# Patient Record
Sex: Male | Born: 1950 | Race: White | Hispanic: No | Smoking: Former smoker
Health system: Southern US, Community
[De-identification: ages and names within clinical notes are randomized; demographics above are authoritative.]

## PROBLEM LIST (undated history)

## (undated) DIAGNOSIS — N189 Chronic kidney disease, unspecified: Secondary | ICD-10-CM

## (undated) DIAGNOSIS — I10 Essential (primary) hypertension: Secondary | ICD-10-CM

## (undated) DIAGNOSIS — E119 Type 2 diabetes mellitus without complications: Secondary | ICD-10-CM

## (undated) DIAGNOSIS — M109 Gout, unspecified: Secondary | ICD-10-CM

## (undated) DIAGNOSIS — R972 Elevated prostate specific antigen [PSA]: Secondary | ICD-10-CM

## (undated) DIAGNOSIS — Z87442 Personal history of urinary calculi: Secondary | ICD-10-CM

## (undated) HISTORY — DX: Elevated prostate specific antigen (PSA): R97.20

## (undated) HISTORY — PX: PROSTATE BIOPSY: SHX241

## (undated) HISTORY — DX: Gout, unspecified: M10.9

## (undated) HISTORY — PX: TONSILLECTOMY: SUR1361

## (undated) HISTORY — PX: CYSTECTOMY: SUR359

---

## 1985-07-15 DIAGNOSIS — N189 Chronic kidney disease, unspecified: Secondary | ICD-10-CM

## 1985-07-15 HISTORY — DX: Chronic kidney disease, unspecified: N18.9

## 2002-06-26 ENCOUNTER — Encounter: Payer: Self-pay | Admitting: Emergency Medicine

## 2002-06-26 ENCOUNTER — Emergency Department (HOSPITAL_COMMUNITY): Admission: EM | Admit: 2002-06-26 | Discharge: 2002-06-26 | Payer: Self-pay | Admitting: Emergency Medicine

## 2009-08-27 ENCOUNTER — Encounter: Payer: Self-pay | Admitting: Internal Medicine

## 2009-09-03 ENCOUNTER — Encounter: Payer: Self-pay | Admitting: Internal Medicine

## 2009-09-08 ENCOUNTER — Ambulatory Visit: Payer: Self-pay | Admitting: Internal Medicine

## 2009-09-08 ENCOUNTER — Telehealth (INDEPENDENT_AMBULATORY_CARE_PROVIDER_SITE_OTHER): Payer: Self-pay | Admitting: *Deleted

## 2009-09-08 DIAGNOSIS — Z87891 Personal history of nicotine dependence: Secondary | ICD-10-CM

## 2009-09-08 DIAGNOSIS — J189 Pneumonia, unspecified organism: Secondary | ICD-10-CM | POA: Insufficient documentation

## 2009-09-21 ENCOUNTER — Encounter: Payer: Self-pay | Admitting: Internal Medicine

## 2009-10-31 ENCOUNTER — Ambulatory Visit: Payer: Self-pay | Admitting: Internal Medicine

## 2009-10-31 DIAGNOSIS — R030 Elevated blood-pressure reading, without diagnosis of hypertension: Secondary | ICD-10-CM

## 2010-08-14 NOTE — Letter (Signed)
Summary: [facility]   Imported By: Sherian Rein 10/04/2009 08:55:40  _____________________________________________________________________  External Attachment:    Type:   Image     Comment:   External Document

## 2010-08-14 NOTE — Letter (Signed)
Summary: [facility]  [facility]   Imported By: Sherian Rein 10/04/2009 08:54:42  _____________________________________________________________________  External Attachment:    Type:   Image     Comment:   External Document

## 2010-08-14 NOTE — Progress Notes (Signed)
Summary: Medical release completed  Medical release completed for receipt of records from [facility]. Faxed to [phone]. Fernando Jordan  September 08, 2009 2:18 PM  Appended Document: Medical release completed Records received from [facility]. [name] aware. Forwarded to her on the dumbwaiter for Dr. Posey Rea to review.

## 2010-08-14 NOTE — Letter (Signed)
Summary: Generic Letter  [facility]  [address] [place], [place] [address]   Phone: [phone]  Fax: [phone]    09/21/2009  [facility] [address] [place], [place]  [address]  Higinio Roger,   I got your papers from the Urgent Care. you did have a pneumonia in the right lung. We should repeat your chest x ray here in a couple months to make sure the pneumonia infiltrate has resolved.           Sincerely,   Jacinta Shoe MD

## 2010-08-14 NOTE — Assessment & Plan Note (Signed)
Summary: new / ok dr Macario Golds / willl bring [phone] - no ins/cd   Vital Signs:  Patient profile:   60 year old male Height:      68 inches Weight:      193 pounds BMI:     29.45 O2 Sat:      97 % Temp:     98.2 degrees F oral Pulse rate:   83 / minute BP sitting:   136 / 96  (left arm)  Vitals Entered By: Tora Perches (September 08, 2009 1:44 PM) CC: new pt to est. Is Patient Diabetic? No   CC:  new pt to est..  History of Present Illness: F/u pneumonia R x 2 wks - went to UC, better now: no CP or SOB Had a reaction to Avelox 1 wk ago: rash and swollen joints - resolved  Preventive Screening-Counseling & Management  Alcohol-Tobacco     Smoking Status: quit  Current Medications (verified): 1)  None  Allergies (verified): 1)  ! Avelox (Moxifloxacin Hcl)  Past History:  Past Medical History: Kidney stones  Past Surgical History: Tonsillectomy  Family History: F died of a heart attack 97  Social History: Divorced 2 children Former Smoker Alcohol use-yes Smoking Status:  quit  Review of Systems  The patient denies anorexia, fever, weight loss, weight gain, vision loss, decreased hearing, hoarseness, chest pain, syncope, dyspnea on exertion, peripheral edema, prolonged cough, headaches, hemoptysis, abdominal pain, melena, hematochezia, severe indigestion/heartburn, hematuria, incontinence, genital sores, muscle weakness, suspicious skin lesions, transient blindness, difficulty walking, depression, unusual weight change, abnormal bleeding, enlarged lymph nodes, and angioedema.         tired cough is better  Physical Exam  General:  NAD Mouth:  WNL Lungs:  CTA B Heart:  RRR Abdomen:  Bowel sounds positive,abdomen soft and non-tender without masses, organomegaly or hernias noted. Msk:  No deformity or scoliosis noted of thoracic or lumbar spine.   Extremities:  No clubbing, cyanosis, edema, or deformity noted with normal full range of motion of all joints.     Neurologic:  No cranial nerve deficits noted. Station and gait are normal. Plantar reflexes are down-going bilaterally. DTRs are symmetrical throughout. Sensory, motor and coordinative functions appear intact. Skin:  Intact without suspicious lesions or rashes Psych:  Cognition and judgment appear intact. Alert and cooperative with normal attention span and concentration. No apparent delusions, illusions, hallucinations   Impression & Recommendations:  Problem # 1:  PNEUMONIA (ICD-486) - treated Assessment Improved  Get UC Xrauy and records  Orders: No Charge Patient Arrived (NCPA0) (NCPA0)  Problem # 2:  Fam h/o CAD Assessment: Comment Only See "Patient Instructions".   Complete Medication List: 1)  Vitamin D 1000 Unit Tabs (Cholecalciferol) .Marland Kitchen.. 1 by mouth qd 2)  Aspirin 81 Mg Tbec (Aspirin) .Marland Kitchen.. 1 by mouth qd  Patient Instructions: 1)  Call if you are not better in a reasonable amount of time or if worse.  [phone]

## 2010-08-14 NOTE — Assessment & Plan Note (Signed)
Summary: FU ON PNEMONIA/ REPEAT X-RAY? /NWS   Vital Signs:  Patient profile:   60 year old male Height:      68 inches Weight:      194.75 pounds BMI:     29.72 O2 Sat:      97 % on Room air Temp:     97.5 degrees F oral Pulse rate:   71 / minute BP sitting:   150 / 90  (left arm) Cuff size:   regular  Vitals Entered By: Lucious Groves (October 31, 2009 10:00 AM)  O2 Flow:  Room air CC: F/U pneumonia--Pt denies any remaining symptoms./kb Is Patient Diabetic? No Pain Assessment Patient in pain? no        CC:  F/U pneumonia--Pt denies any remaining symptoms./kb.  History of Present Illness: F/u pneumonia. Feeling well.  Current Medications (verified): 1)  Vitamin D 1000 Unit Tabs (Cholecalciferol) .Marland Kitchen.. 1 By Mouth Qd 2)  Aspirin 81 Mg Tbec (Aspirin) .Marland Kitchen.. 1 By Mouth Qd  Allergies (verified): 1)  ! Avelox (Moxifloxacin Hcl)  Past History:  Past Medical History: Kidney stones Blood donor Pneumonia 2011 RML  Physical Exam  General:  NAD Lungs:  CTA B Heart:  RRR   Impression & Recommendations:  Problem # 1:  PNEUMONIA (ICD-486) resolved Assessment Improved  Orders: T-2 View CXR, Same Day (71020.5TC) No Charge Patient Arrived (NCPA0) (NCPA0)  Problem # 2:  ELEVATED BLOOD PRESSURE (ICD-796.2) Assessment: Unchanged States BP is better (NL) at home  Complete Medication List: 1)  Vitamin D 1000 Unit Tabs (Cholecalciferol) .Marland Kitchen.. 1 by mouth qd 2)  Aspirin 81 Mg Tbec (Aspirin) .Marland Kitchen.. 1 by mouth qd  Patient Instructions: 1)  Call if problems

## 2011-10-07 ENCOUNTER — Telehealth: Payer: Self-pay

## 2011-10-07 ENCOUNTER — Ambulatory Visit: Payer: Self-pay | Admitting: Emergency Medicine

## 2011-10-07 DIAGNOSIS — M109 Gout, unspecified: Secondary | ICD-10-CM

## 2011-10-07 DIAGNOSIS — I10 Essential (primary) hypertension: Secondary | ICD-10-CM

## 2011-10-07 MED ORDER — INDOMETHACIN 50 MG PO CAPS: 50.0000 mg | ORAL_CAPSULE | ORAL | Status: DC | PRN

## 2011-10-07 NOTE — Patient Instructions (Signed)
Gout Gout is an inflammatory condition (arthritis) caused by a buildup of uric acid crystals in the joints. Uric acid is a chemical that is normally present in the blood. Under some circumstances, uric acid can form into crystals in your joints. This causes joint redness, soreness, and swelling (inflammation). Repeat attacks are common. Over time, uric acid crystals can form into masses (tophi) near a joint, causing disfigurement. Gout is treatable and often preventable. CAUSES  The disease begins with elevated levels of uric acid in the blood. Uric acid is produced by your body when it breaks down a naturally found substance called purines. This also happens when you eat certain foods such as meats and fish. Causes of an elevated uric acid level include:  Being passed down from parent to child (heredity).   Diseases that cause increased uric acid production (obesity, psoriasis, some cancers).   Excessive alcohol use.   Diet, especially diets rich in meat and seafood.   Medicines, including certain cancer-fighting drugs (chemotherapy), diuretics, and aspirin.   Chronic kidney disease. The kidneys are no longer able to remove uric acid well.   Problems with metabolism.  Conditions strongly associated with gout include:  Obesity.   High blood pressure.   High cholesterol.   Diabetes.  Not everyone with elevated uric acid levels gets gout. It is not understood why some people get gout and others do not. Surgery, joint injury, and eating too much of certain foods are some of the factors that can lead to gout. SYMPTOMS   An attack of gout comes on quickly. It causes intense pain with redness, swelling, and warmth in a joint.   Fever can occur.   Often, only one joint is involved. Certain joints are more commonly involved:   Base of the big toe.   Knee.   Ankle.   Wrist.   Finger.  Without treatment, an attack usually goes away in a few days to weeks. Between attacks, you  usually will not have symptoms, which is different from many other forms of arthritis. DIAGNOSIS  Your caregiver will suspect gout based on your symptoms and exam. Removal of fluid from the joint (arthrocentesis) is done to check for uric acid crystals. Your caregiver will give you a medicine that numbs the area (local anesthetic) and use a needle to remove joint fluid for exam. Gout is confirmed when uric acid crystals are seen in joint fluid, using a special microscope. Sometimes, blood, urine, and X-ray tests are also used. TREATMENT  There are 2 phases to gout treatment: treating the sudden onset (acute) attack and preventing attacks (prophylaxis). Treatment of an Acute Attack  Medicines are used. These include anti-inflammatory medicines or steroid medicines.   An injection of steroid medicine into the affected joint is sometimes necessary.   The painful joint is rested. Movement can worsen the arthritis.   You may use warm or cold treatments on painful joints, depending which works best for you.   Discuss the use of coffee, vitamin C, or cherries with your caregiver. These may be helpful treatment options.  Treatment to Prevent Attacks After the acute attack subsides, your caregiver may advise prophylactic medicine. These medicines either help your kidneys eliminate uric acid from your body or decrease your uric acid production. You may need to stay on these medicines for a very long time. The early phase of treatment with prophylactic medicine can be associated with an increase in acute gout attacks. For this reason, during the first few months   of treatment, your caregiver may also advise you to take medicines usually used for acute gout treatment. Be sure you understand your caregiver's directions. You should also discuss dietary treatment with your caregiver. Certain foods such as meats and fish can increase uric acid levels. Other foods such as dairy can decrease levels. Your caregiver  can give you a list of foods to avoid. HOME CARE INSTRUCTIONS   Do not take aspirin to relieve pain. This raises uric acid levels.   Only take over-the-counter or prescription medicines for pain, discomfort, or fever as directed by your caregiver.   Rest the joint as much as possible. When in bed, keep sheets and blankets off painful areas.   Keep the affected joint raised (elevated).   Use crutches if the painful joint is in your leg.   Drink enough water and fluids to keep your urine clear or pale yellow. This helps your body get rid of uric acid. Do not drink alcoholic beverages. They slow the passage of uric acid.   Follow your caregiver's dietary instructions. Pay careful attention to the amount of protein you eat. Your daily diet should emphasize fruits, vegetables, whole grains, and fat-free or low-fat milk products.   Maintain a healthy body weight.  SEEK MEDICAL CARE IF:   You have an oral temperature above 102 F (38.9 C).   You develop diarrhea, vomiting, or any side effects from medicines.   You do not feel better in 24 hours, or you are getting worse.  SEEK IMMEDIATE MEDICAL CARE IF:   Your joint becomes suddenly more tender and you have:   Chills.   An oral temperature above 102 F (38.9 C), not controlled by medicine.  MAKE SURE YOU:   Understand these instructions.   Will watch your condition.   Will get help right away if you are not doing well or get worse.  Document Released: 06/28/2000 Document Revised: 06/20/2011 Document Reviewed: 10/09/2009 [facility] Patient Information 2012 [place], [place].Hypertension As your heart beats, it forces blood through your arteries. This force is your blood pressure. If the pressure is too high, it is called hypertension (HTN) or high blood pressure. HTN is dangerous because you may have it and not know it. High blood pressure may mean that your heart has to work harder to pump blood. Your arteries may be narrow or stiff.  The extra work puts you at risk for heart disease, stroke, and other problems.  Blood pressure consists of two numbers, a higher number over a lower, 110/72, for example. It is stated as "110 over 72." The ideal is below 120 for the top number (systolic) and under 80 for the bottom (diastolic). Write down your blood pressure today. You should pay close attention to your blood pressure if you have certain conditions such as:  Heart failure.   Prior heart attack.   Diabetes   Chronic kidney disease.   Prior stroke.   Multiple risk factors for heart disease.  To see if you have HTN, your blood pressure should be measured while you are seated with your arm held at the level of the heart. It should be measured at least twice. A one-time elevated blood pressure reading (especially in the Emergency Department) does not mean that you need treatment. There may be conditions in which the blood pressure is different between your right and left arms. It is important to see your caregiver soon for a recheck. Most people have essential hypertension which means that there is not  a specific cause. This type of high blood pressure may be lowered by changing lifestyle factors such as:  Stress.   Smoking.   Lack of exercise.   Excessive weight.   Drug/tobacco/alcohol use.   Eating less salt.  Most people do not have symptoms from high blood pressure until it has caused damage to the body. Effective treatment can often prevent, delay or reduce that damage. TREATMENT  When a cause has been identified, treatment for high blood pressure is directed at the cause. There are a large number of medications to treat HTN. These fall into several categories, and your caregiver will help you select the medicines that are best for you. Medications may have side effects. You should review side effects with your caregiver. If your blood pressure stays high after you have made lifestyle changes or started on medicines,     Your medication(s) may need to be changed.   Other problems may need to be addressed.   Be certain you understand your prescriptions, and know how and when to take your medicine.   Be sure to follow up with your caregiver within the time frame advised (usually within two weeks) to have your blood pressure rechecked and to review your medications.   If you are taking more than one medicine to lower your blood pressure, make sure you know how and at what times they should be taken. Taking two medicines at the same time can result in blood pressure that is too low.  SEEK IMMEDIATE MEDICAL CARE IF:  You develop a severe headache, blurred or changing vision, or confusion.   You have unusual weakness or numbness, or a faint feeling.   You have severe chest or abdominal pain, vomiting, or breathing problems.  MAKE SURE YOU:   Understand these instructions.   Will watch your condition.   Will get help right away if you are not doing well or get worse.  Document Released: 07/01/2005 Document Revised: 06/20/2011 Document Reviewed: 02/19/2008 [facility] Patient Information 2012 [place], [place].

## 2011-10-07 NOTE — Telephone Encounter (Signed)
PT WENT TO PHARMACY TO GET HIS MEDS REFILLED AND WAS ONLY GIVEN 21 WHEN HE USUALLY GET 30. PLEASE CALL [phone]

## 2011-10-07 NOTE — Telephone Encounter (Signed)
Please call patient let him know I did put a refill on his medications so he has enough for a full week with a refill.

## 2011-10-07 NOTE — Telephone Encounter (Signed)
LMOM notifying patient.

## 2011-10-07 NOTE — Progress Notes (Signed)
  Subjective:    Patient ID: Fernando Jordan, male    DOB: May 30, 1951, 61 y.o.   MRN: [id number]  HPI patient enters to get a refill on his indomethacin he takes for gout. He intermittently has further service gallop. He has no history of hypertension. He gave blood recently and was told his pressure was okay to give blood.    Review of Systems specifically he denies chest pain shortness of breath and needs ongoing symptoms. His recent gout flare was involving the Achilles tendon to his right ankle.     Objective:   Physical Exam  Eyes: Pupils are equal, round, and reactive to light.  Neck: No JVD present. No thyromegaly present.  Cardiovascular: Normal rate and regular rhythm.   Pulmonary/Chest: Breath sounds normal. No respiratory distress. He has no wheezes.  Musculoskeletal:       Patient has tenderness at the Achilles attachment to the right heel .  Lymphadenopathy:    He has no cervical adenopathy.          Assessment & Plan:  Assessments elevated blood pressure. Patient is not financially able to go through any testing at the present time. He does not want to take medication for his blood pressure. He is willing to check his pressure regularly. I would give him a handout about low salt exercise and weight loss. Advised that he should have a checkup but he is unable to because of no insurance

## 2012-05-11 ENCOUNTER — Ambulatory Visit: Payer: Self-pay | Admitting: *Deleted

## 2012-05-11 DIAGNOSIS — Z23 Encounter for immunization: Secondary | ICD-10-CM

## 2013-01-17 ENCOUNTER — Ambulatory Visit: Payer: Self-pay | Admitting: Family Medicine

## 2013-01-17 VITALS — BP 170/90 | HR 101 | Temp 99.1°F | Resp 18 | Ht 67.0 in | Wt 195.0 lb

## 2013-01-17 DIAGNOSIS — R030 Elevated blood-pressure reading, without diagnosis of hypertension: Secondary | ICD-10-CM

## 2013-01-17 DIAGNOSIS — M109 Gout, unspecified: Secondary | ICD-10-CM

## 2013-01-17 DIAGNOSIS — I10 Essential (primary) hypertension: Secondary | ICD-10-CM

## 2013-01-17 DIAGNOSIS — Z79899 Other long term (current) drug therapy: Secondary | ICD-10-CM

## 2013-01-17 LAB — COMPREHENSIVE METABOLIC PANEL
AST: 13 U/L (ref 0–37)
BUN: 18 mg/dL (ref 6–23)
Calcium: 8.9 mg/dL (ref 8.4–10.5)
Chloride: 105 mEq/L (ref 96–112)
Creat: 1.13 mg/dL (ref 0.50–1.35)

## 2013-01-17 MED ORDER — PREDNISONE 50 MG PO TABS: 50.0000 mg | ORAL_TABLET | Freq: Every day | ORAL | Status: DC

## 2013-01-17 MED ORDER — INDOMETHACIN 50 MG PO CAPS: 50.0000 mg | ORAL_CAPSULE | ORAL | Status: DC | PRN

## 2013-01-17 MED ORDER — ALLOPURINOL 300 MG PO TABS: 300.0000 mg | ORAL_TABLET | Freq: Every day | ORAL | Status: AC

## 2013-01-17 NOTE — Progress Notes (Signed)
  Subjective:    Patient ID: Fernando Jordan, male    DOB: 1951-07-07, 62 y.o.   MRN: [id number] Chief Complaint  Patient presents with  . Gout    right knee since January 15, 2013   HPI  For the last month or so he has had intermittent flairs of gout in his bilateral knees and ankles.  Is currently having a flair in his right knee which started 2d ago.  Has been taking indomethacin regularly for the past few days.  Would be very interested in preventative medicine that he could take daily so he does not have so many flairs. Leaving in 3d for [place] x 6 wks for a job. Would like to minimize.  Past Medical History  Diagnosis Date  . Gout    No current outpatient prescriptions on file prior to visit.   No current facility-administered medications on file prior to visit.   Allergies  Allergen Reactions  . Moxifloxacin     REACTION: rash and swollen joints      Review of Systems  Constitutional: Positive for activity change. Negative for fever, chills and diaphoresis.  Cardiovascular: Negative for leg swelling.  Musculoskeletal: Positive for joint swelling, arthralgias and gait problem. Negative for myalgias.  Skin: Positive for color change. Negative for pallor and rash.  Hematological: Negative for adenopathy. Does not bruise/bleed easily.  Psychiatric/Behavioral: Positive for sleep disturbance.      BP 170/90  Pulse 101  Temp(Src) 99.1 F (37.3 C) (Oral)  Resp 18  Ht 5\' 7"  (1.702 m)  Wt 195 lb (88.451 kg)  BMI 30.53 kg/m2  SpO2 99% Objective:   Physical Exam  Constitutional: He is oriented to person, place, and time. He appears well-developed and well-nourished. No distress.  HENT:  Head: Normocephalic and atraumatic.  Eyes: No scleral icterus.  Pulmonary/Chest: Effort normal.  Musculoskeletal:       Right knee: He exhibits decreased range of motion, swelling, effusion and erythema. Tenderness found. Medial joint line and lateral joint line tenderness noted.  Right  knee warm with moderate tenderness and mild erythema.  Neurological: He is alert and oriented to person, place, and time.  Skin: Skin is warm and dry. He is not diaphoretic.  Psychiatric: He has a normal mood and affect. His behavior is normal.      Assessment & Plan:  ELEVATED BLOOD PRESSURE  Essential hypertension, benign - Checks 4x/yr at [facility] - always ok - has white coat HTN.  Gout  Encounter for long-term (current) use of other medications - Plan: Uric acid, Comprehensive metabolic panel - If labs are ok and confirm elevated uric acid level, will refill allopurinol x 1 yr.    HM - Will be eligible for Medicare in 1 yr and will RTC for initial medicare wellness exam.  Meds ordered this encounter  Medications  . predniSONE (DELTASONE) 50 MG tablet    Sig: Take 1 tablet (50 mg total) by mouth daily.    Dispense:  5 tablet    Refill:  1  . indomethacin (INDOCIN) 50 MG capsule    Sig: Take 1 capsule (50 mg total) by mouth as needed.    Dispense:  21 capsule    Refill:  1  . allopurinol (ZYLOPRIM) 300 MG tablet    Sig: Take 1 tablet (300 mg total) by mouth daily.    Dispense:  30 tablet    Refill:  2

## 2013-01-17 NOTE — Patient Instructions (Addendum)
Gout  Gout is an inflammatory condition (arthritis) caused by a buildup of uric acid crystals in the joints. Uric acid is a chemical that is normally present in the blood. Under some circumstances, uric acid can form into crystals in your joints. This causes joint redness, soreness, and swelling (inflammation). Repeat attacks are common. Over time, uric acid crystals can form into masses (tophi) near a joint, causing disfigurement. Gout is treatable and often preventable.  CAUSES   The disease begins with elevated levels of uric acid in the blood. Uric acid is produced by your body when it breaks down a naturally found substance called purines. This also happens when you eat certain foods such as meats and fish. Causes of an elevated uric acid level include:   Being passed down from parent to child (heredity).   Diseases that cause increased uric acid production (obesity, psoriasis, some cancers).   Excessive alcohol use.   Diet, especially diets rich in meat and seafood.   Medicines, including certain cancer-fighting drugs (chemotherapy), diuretics, and aspirin.   Chronic kidney disease. The kidneys are no longer able to remove uric acid well.   Problems with metabolism.  Conditions strongly associated with gout include:   Obesity.   High blood pressure.   High cholesterol.   Diabetes.  Not everyone with elevated uric acid levels gets gout. It is not understood why some people get gout and others do not. Surgery, joint injury, and eating too much of certain foods are some of the factors that can lead to gout.  SYMPTOMS    An attack of gout comes on quickly. It causes intense pain with redness, swelling, and warmth in a joint.   Fever can occur.   Often, only one joint is involved. Certain joints are more commonly involved:   Base of the big toe.   Knee.   Ankle.   Wrist.   Finger.  Without treatment, an attack usually goes away in a few days to weeks. Between attacks, you usually will not have  symptoms, which is different from many other forms of arthritis.  DIAGNOSIS   Your caregiver will suspect gout based on your symptoms and exam. Removal of fluid from the joint (arthrocentesis) is done to check for uric acid crystals. Your caregiver will give you a medicine that numbs the area (local anesthetic) and use a needle to remove joint fluid for exam. Gout is confirmed when uric acid crystals are seen in joint fluid, using a special microscope. Sometimes, blood, urine, and X-ray tests are also used.  TREATMENT   There are 2 phases to gout treatment: treating the sudden onset (acute) attack and preventing attacks (prophylaxis).  Treatment of an Acute Attack   Medicines are used. These include anti-inflammatory medicines or steroid medicines.   An injection of steroid medicine into the affected joint is sometimes necessary.   The painful joint is rested. Movement can worsen the arthritis.   You may use warm or cold treatments on painful joints, depending which works best for you.   Discuss the use of coffee, vitamin C, or cherries with your caregiver. These may be helpful treatment options.  Treatment to Prevent Attacks  After the acute attack subsides, your caregiver may advise prophylactic medicine. These medicines either help your kidneys eliminate uric acid from your body or decrease your uric acid production. You may need to stay on these medicines for a very long time.  The early phase of treatment with prophylactic medicine can be associated   with an increase in acute gout attacks. For this reason, during the first few months of treatment, your caregiver may also advise you to take medicines usually used for acute gout treatment. Be sure you understand your caregiver's directions.  You should also discuss dietary treatment with your caregiver. Certain foods such as meats and fish can increase uric acid levels. Other foods such as dairy can decrease levels. Your caregiver can give you a list of foods  to avoid.  HOME CARE INSTRUCTIONS    Do not take aspirin to relieve pain. This raises uric acid levels.   Only take over-the-counter or prescription medicines for pain, discomfort, or fever as directed by your caregiver.   Rest the joint as much as possible. When in bed, keep sheets and blankets off painful areas.   Keep the affected joint raised (elevated).   Use crutches if the painful joint is in your leg.   Drink enough water and fluids to keep your urine clear or pale yellow. This helps your body get rid of uric acid. Do not drink alcoholic beverages. They slow the passage of uric acid.   Follow your caregiver's dietary instructions. Pay careful attention to the amount of protein you eat. Your daily diet should emphasize fruits, vegetables, whole grains, and fat-free or low-fat milk products.   Maintain a healthy body weight.  SEEK MEDICAL CARE IF:    You have an oral temperature above 102 F (38.9 C).   You develop diarrhea, vomiting, or any side effects from medicines.   You do not feel better in 24 hours, or you are getting worse.  SEEK IMMEDIATE MEDICAL CARE IF:    Your joint becomes suddenly more tender and you have:   Chills.   An oral temperature above 102 F (38.9 C), not controlled by medicine.  MAKE SURE YOU:    Understand these instructions.   Will watch your condition.   Will get help right away if you are not doing well or get worse.  Document Released: 06/28/2000 Document Revised: 09/23/2011 Document Reviewed: 10/09/2009  [employer] Patient Information 2014 [employer].

## 2013-01-26 ENCOUNTER — Telehealth: Payer: Self-pay

## 2013-01-26 NOTE — Telephone Encounter (Signed)
Pt called and reported that his gout flair has resolved after taking the prednisone (completed last Thurs) and the allopurinol. He stated that the last 3-4 nights he has been having to get up to urinate 3 or 4 times a night, but that he then has trouble getting urine to flow. D/W him possibility of UTI, but advised that there is also a possibility of renal SEs from allopurinol. Advised pt to DC allopurinol and go to an urgent care in [place] where he will be until Aug 20 and have them check his renal function and urine sample, today, to make sure his kidneys are functioning properly. Pt agreed. Dr Clelia Croft, do you have any other instr's for pt?

## 2013-01-26 NOTE — Telephone Encounter (Signed)
Excellent advise. Agree w/ plan.

## 2013-01-31 ENCOUNTER — Telehealth: Payer: Self-pay

## 2013-01-31 NOTE — Telephone Encounter (Signed)
Patient calling wanting to talk to Dr. Clelia Croft. He is in [place] and had a  reaction to the gout medication given from our office- which he says he stopped taking per Dr. Clelia Croft. He was seen at a different urgent care in [place] and they gave him  some medication for that. Patient just wants advice on how to follow up from his reaction in [place] and is concerned about his Gout. Please advise.   Best number: [phone]

## 2013-02-02 NOTE — Telephone Encounter (Signed)
Spoke with patient and wants to know if he should start back on allopurinol. His gout has resolved. After he stopped allopurinol he did take a couple doses of indomethacin and he is not having any pain redness or swelling. He was put on flomax for enlarged prostate that he was dx with at urgent care in [place].  He wasn't sure if he should start back on allopurinol to prevent flare up or should he wait until he gets back to [place]? RTC? Please advise

## 2013-02-02 NOTE — Telephone Encounter (Signed)
His uric acid level was pretty much normal so there is a chance that the allopurinol might not even help him.  However, I do think he should go ahead and restart it.  If he has ANY adverse reactions (more gout flairs, changes in urine, etc) stop immed - it's not worth taking it if he is having any side effects from it. However, it seems likely that the changes in his prostate were completely unrelated to the allopurinol and it is best to start the allopurinol now when he is NOT having ANY gout symptoms.

## 2013-02-03 NOTE — Telephone Encounter (Signed)
Informed pt and he will restart as advised, but will stop immed if any adverse reactions occur.

## 2013-02-06 ENCOUNTER — Telehealth: Payer: Self-pay

## 2013-02-06 NOTE — Telephone Encounter (Signed)
Patient would like a nurse or doctor to call him regarding his gout that he has been struggling with please call him at [phone]

## 2013-02-08 MED ORDER — INDOMETHACIN 50 MG PO CAPS: 50.0000 mg | ORAL_CAPSULE | Freq: Three times a day (TID) | ORAL | Status: DC

## 2013-02-08 NOTE — Telephone Encounter (Signed)
Patient has seen Dr Clelia Croft and was given Allopurinol had reaction from this. He had urinary retention associated with the medication. He states he tried the medication again, and has had problems again. He has d/c the medication. He feels better since d/c the medication, is taking indocin prn. Wants to know if there is anything he can try, please advise. He wants an alternative medication. Please advise.

## 2013-02-08 NOTE — Telephone Encounter (Signed)
Called pt and discussed below with him. He agrees w/ the plan to continue prn indomethacin until further evaluation and w/u of his gout can be done.

## 2013-02-08 NOTE — Telephone Encounter (Signed)
There are other medications but unfortunately none of them are generic and so quite expensive and I am not sure any of them will help so he is likely to spend hundreds of dollars and have the same side effects.  Also, since his uric acid level was actually normal when we checked it I am really not confident at all that he would benefit at all from gout prophylaxis. Probenacid is a different type of gout prophylaxis but we don't really prescribe it as you can't use any NSAID with with - so absolutely no indocin or any otc meds other than tylenol (which doesn't really help gout.)  Probenacid can also be hard on the kidneys so requires close lab monitoring and if he did get a gout flair on it - he would be in a pickle - and still not sure it would actually help at all since his uric acid level was high end of normal range.  Could try colcrys but often causes diarrhea and is VERY expensive - esp w/o insurance.  The best thing to do would be to take daily indocin for prophylaxis - but don't use any other otc meds other than tylenol - and more during gout flair. Then in a year when he gets insurance we can do much more testing to confirm gout diagnosis rather than another type of inflammatory arthritis and be able to try alternative meds w/o breaking the bank.

## 2013-02-11 ENCOUNTER — Encounter: Payer: Self-pay | Admitting: *Deleted

## 2013-02-11 DIAGNOSIS — R350 Frequency of micturition: Secondary | ICD-10-CM

## 2013-03-10 ENCOUNTER — Telehealth: Payer: Self-pay

## 2013-03-10 NOTE — Telephone Encounter (Signed)
What are the questions? He states he has been nauseated and vomited. Thinks was another side effect from the medications for the Gout (indocin) the Allopurinol caused urinary retention (taking Flomax). Advised him to d/c this medication he has stopped it now.

## 2013-03-10 NOTE — Telephone Encounter (Signed)
Patient has some questions for Dr. Clelia Croft. Patient would not specify.  [phone]

## 2013-09-21 DIAGNOSIS — Z23 Encounter for immunization: Secondary | ICD-10-CM | POA: Diagnosis not present

## 2013-11-23 DIAGNOSIS — H5200 Hypermetropia, unspecified eye: Secondary | ICD-10-CM | POA: Diagnosis not present

## 2013-11-23 DIAGNOSIS — H524 Presbyopia: Secondary | ICD-10-CM | POA: Diagnosis not present

## 2013-11-23 DIAGNOSIS — H25039 Anterior subcapsular polar age-related cataract, unspecified eye: Secondary | ICD-10-CM | POA: Diagnosis not present

## 2013-11-23 DIAGNOSIS — H52229 Regular astigmatism, unspecified eye: Secondary | ICD-10-CM | POA: Diagnosis not present

## 2014-05-18 DIAGNOSIS — M109 Gout, unspecified: Secondary | ICD-10-CM | POA: Diagnosis not present

## 2014-05-18 DIAGNOSIS — I10 Essential (primary) hypertension: Secondary | ICD-10-CM | POA: Diagnosis not present

## 2014-05-18 DIAGNOSIS — Z23 Encounter for immunization: Secondary | ICD-10-CM | POA: Diagnosis not present

## 2014-05-18 DIAGNOSIS — T148 Other injury of unspecified body region: Secondary | ICD-10-CM | POA: Diagnosis not present

## 2014-08-18 DIAGNOSIS — D509 Iron deficiency anemia, unspecified: Secondary | ICD-10-CM | POA: Diagnosis not present

## 2014-08-18 DIAGNOSIS — N200 Calculus of kidney: Secondary | ICD-10-CM | POA: Diagnosis not present

## 2014-08-18 DIAGNOSIS — I10 Essential (primary) hypertension: Secondary | ICD-10-CM | POA: Diagnosis not present

## 2014-08-18 DIAGNOSIS — M109 Gout, unspecified: Secondary | ICD-10-CM | POA: Diagnosis not present

## 2014-08-18 DIAGNOSIS — Z125 Encounter for screening for malignant neoplasm of prostate: Secondary | ICD-10-CM | POA: Diagnosis not present

## 2014-08-18 DIAGNOSIS — E559 Vitamin D deficiency, unspecified: Secondary | ICD-10-CM | POA: Diagnosis not present

## 2014-08-18 DIAGNOSIS — H6120 Impacted cerumen, unspecified ear: Secondary | ICD-10-CM | POA: Diagnosis not present

## 2014-08-18 DIAGNOSIS — Z0001 Encounter for general adult medical examination with abnormal findings: Secondary | ICD-10-CM | POA: Diagnosis not present

## 2014-11-03 ENCOUNTER — Other Ambulatory Visit: Payer: Self-pay | Admitting: Gastroenterology

## 2014-11-08 ENCOUNTER — Ambulatory Visit (HOSPITAL_COMMUNITY): Payer: Self-pay | Attending: Gastroenterology

## 2014-11-10 DIAGNOSIS — H2513 Age-related nuclear cataract, bilateral: Secondary | ICD-10-CM | POA: Diagnosis not present

## 2014-11-28 ENCOUNTER — Encounter (HOSPITAL_COMMUNITY): Payer: Self-pay | Admitting: *Deleted

## 2014-11-29 ENCOUNTER — Other Ambulatory Visit: Payer: Self-pay | Admitting: Gastroenterology

## 2014-12-03 NOTE — Anesthesia Preprocedure Evaluation (Addendum)
Anesthesia Evaluation  Patient identified by MRN, date of birth, ID band Patient awake    Reviewed: Allergy & Precautions, NPO status , Patient's Chart, lab work & pertinent test results, reviewed documented beta blocker date and time   Airway Mallampati: II   Neck ROM: Full    Dental  (+) Dental Advisory Given, Implants   Pulmonary former smoker (quit 1972),  breath sounds clear to auscultation        Cardiovascular hypertension, Pt. on medications Rhythm:Regular     Neuro/Psych negative neurological ROS  negative psych ROS   GI/Hepatic RO GI bleeding   Endo/Other    Renal/GU      Musculoskeletal GOUT prednisone   Abdominal (+)  Abdomen: soft.    Peds  Hematology  (+) anemia , anemia   Anesthesia Other Findings BP elevated, no meds this am, will RX Apresoline  Reproductive/Obstetrics                            Anesthesia Physical Anesthesia Plan  ASA: II  Anesthesia Plan: MAC   Post-op Pain Management:    Induction: Intravenous  Airway Management Planned:   Additional Equipment:   Intra-op Plan:   Post-operative Plan:   Informed Consent: I have reviewed the patients History and Physical, chart, labs and discussed the procedure including the risks, benefits and alternatives for the proposed anesthesia with the patient or authorized representative who has indicated his/her understanding and acceptance.     Plan Discussed with:   Anesthesia Plan Comments: (Ask about prednisone)        Anesthesia Quick Evaluation

## 2014-12-05 ENCOUNTER — Ambulatory Visit (HOSPITAL_COMMUNITY)
Admission: RE | Admit: 2014-12-05 | Discharge: 2014-12-05 | Disposition: A | Discharge status: Home / Self Care | Payer: Medicare Other | Source: Ambulatory Visit | Attending: Gastroenterology | Admitting: Gastroenterology

## 2014-12-05 ENCOUNTER — Ambulatory Visit (HOSPITAL_COMMUNITY): Payer: Medicare Other | Admitting: Anesthesiology

## 2014-12-05 ENCOUNTER — Encounter (HOSPITAL_COMMUNITY): Payer: Self-pay | Admitting: Anesthesiology

## 2014-12-05 ENCOUNTER — Encounter (HOSPITAL_COMMUNITY)
Admission: RE | Disposition: A | Discharge status: Home / Self Care | Payer: Self-pay | Source: Ambulatory Visit | Attending: Gastroenterology

## 2014-12-05 DIAGNOSIS — Z7952 Long term (current) use of systemic steroids: Secondary | ICD-10-CM | POA: Diagnosis not present

## 2014-12-05 DIAGNOSIS — Z79899 Other long term (current) drug therapy: Secondary | ICD-10-CM | POA: Diagnosis not present

## 2014-12-05 DIAGNOSIS — K2950 Unspecified chronic gastritis without bleeding: Secondary | ICD-10-CM | POA: Diagnosis not present

## 2014-12-05 DIAGNOSIS — N400 Enlarged prostate without lower urinary tract symptoms: Secondary | ICD-10-CM | POA: Diagnosis not present

## 2014-12-05 DIAGNOSIS — Z87891 Personal history of nicotine dependence: Secondary | ICD-10-CM | POA: Diagnosis not present

## 2014-12-05 DIAGNOSIS — I10 Essential (primary) hypertension: Secondary | ICD-10-CM | POA: Insufficient documentation

## 2014-12-05 DIAGNOSIS — B9681 Helicobacter pylori [H. pylori] as the cause of diseases classified elsewhere: Secondary | ICD-10-CM | POA: Diagnosis not present

## 2014-12-05 DIAGNOSIS — Z791 Long term (current) use of non-steroidal anti-inflammatories (NSAID): Secondary | ICD-10-CM | POA: Insufficient documentation

## 2014-12-05 DIAGNOSIS — K621 Rectal polyp: Secondary | ICD-10-CM | POA: Insufficient documentation

## 2014-12-05 DIAGNOSIS — M109 Gout, unspecified: Secondary | ICD-10-CM | POA: Diagnosis not present

## 2014-12-05 DIAGNOSIS — D509 Iron deficiency anemia, unspecified: Secondary | ICD-10-CM | POA: Insufficient documentation

## 2014-12-05 DIAGNOSIS — K5790 Diverticulosis of intestine, part unspecified, without perforation or abscess without bleeding: Secondary | ICD-10-CM | POA: Diagnosis not present

## 2014-12-05 DIAGNOSIS — K5730 Diverticulosis of large intestine without perforation or abscess without bleeding: Secondary | ICD-10-CM | POA: Insufficient documentation

## 2014-12-05 HISTORY — PX: ESOPHAGOGASTRODUODENOSCOPY (EGD) WITH PROPOFOL: SHX5813

## 2014-12-05 HISTORY — PX: COLONOSCOPY WITH PROPOFOL: SHX5780

## 2014-12-05 HISTORY — DX: Personal history of urinary calculi: Z87.442

## 2014-12-05 SURGERY — COLONOSCOPY WITH PROPOFOL
Anesthesia: Monitor Anesthesia Care

## 2014-12-05 MED ORDER — PROPOFOL INFUSION 10 MG/ML OPTIME
INTRAVENOUS | Status: DC | PRN
  Administered 2014-12-05: 100 ug/kg/min via INTRAVENOUS

## 2014-12-05 MED ORDER — BUTAMBEN-TETRACAINE-BENZOCAINE 2-2-14 % EX AERO
INHALATION_SPRAY | CUTANEOUS | Status: DC | PRN
  Administered 2014-12-05: 1 via TOPICAL

## 2014-12-05 MED ORDER — PROPOFOL 10 MG/ML IV BOLUS
INTRAVENOUS | Status: AC
  Filled 2014-12-05: qty 20

## 2014-12-05 MED ORDER — SODIUM CHLORIDE 0.9 % IV SOLN: INTRAVENOUS | Status: DC

## 2014-12-05 MED ORDER — HYDRALAZINE HCL 20 MG/ML IJ SOLN
INTRAMUSCULAR | Status: DC | PRN
  Administered 2014-12-05: 10 mg via INTRAVENOUS

## 2014-12-05 MED ORDER — PROPOFOL 10 MG/ML IV BOLUS
INTRAVENOUS | Status: DC | PRN
  Administered 2014-12-05 (×2): 40 mg via INTRAVENOUS
  Administered 2014-12-05: 20 mg via INTRAVENOUS

## 2014-12-05 MED ORDER — LACTATED RINGERS IV SOLN
INTRAVENOUS | Status: DC
  Administered 2014-12-05: 1000 mL via INTRAVENOUS

## 2014-12-05 MED ORDER — PROMETHAZINE HCL 25 MG/ML IJ SOLN: 6.2500 mg | INTRAMUSCULAR | Status: DC | PRN

## 2014-12-05 MED ORDER — HYDRALAZINE HCL 20 MG/ML IJ SOLN
INTRAMUSCULAR | Status: AC
  Filled 2014-12-05: qty 1

## 2014-12-05 SURGICAL SUPPLY — 24 items

## 2014-12-05 NOTE — Op Note (Signed)
Problem: Iron deficiency anemia. Hemoglobin 12.7 g. Serum iron saturation 16%.  Endoscopist: Fernando Jordan  Premedication: Propofol administered by anesthesia  Procedure: Diagnostic esophagogastroduodenoscopy with small bowel biopsies and random gastric biopsies The patient was placed in the left lateral decubitus position. The Pentax gastroscope was passed through the posterior hypopharynx into the proximal esophagus without difficulty. The hypopharynx, larynx, and vocal cords appeared normal.  Esophagoscopy: The proximal, mid, and lower segments of the esophageal mucosa appeared normal. The squamocolumnar junction was noted at 40 cm from the incisor teeth. There was no endoscopic evidence for the presence of erosive esophagitis or Barrett's esophagus.  Gastroscopy: Retroflex view of the gastric cardia and fundus was normal. The gastric body, antrum, and pylorus appeared normal. Biopsies were taken from the gastric antrum, gastric body, and gastric cardia to rule out H. pylori gastritis  Duodenoscopy: The duodenal bulb and descending duodenum appeared normal. Two biopsies were taken from the duodenal bulb and four biopsies were taken from the descending duodenum to look for villous atrophy associated with celiac disease.  Assessment: Normal esophagogastroduodenoscopy. Gastric biopsies to rule out H. pylori gastritis and duodenal biopsies to rule out villous atrophy associated with celiac disease pending  Procedure: Diagnostic colonoscopy Anal inspection and digital rectal exam were normal. The Pentax pediatric colonoscope was introduced into the rectum and advanced to the cecum. A normal-appearing appendiceal orifice was identified. A normal-appearing ileocecal valve was identified. Colonic preparation for the exam today was good. Withdrawal time was 10 minutes  Rectum. From the mid rectum a 2 mm sized polyp was removed with the cold biopsy forceps. Retroflexed view of the distal rectum was  normal.  Sigmoid colon and descending colon. Left colonic diverticulosis  Splenic flexure. Normal  Transverse colon. Normal  Hepatic flexure. Normal  Ascending colon. Normal  Cecum and ileocecal valve. Normal  Assessment: A diminutive polyp was removed from the rectum with the cold biopsy forceps. Otherwise normal colonoscopy.  Plan: I will review the rectal polyp pathology to determine when the patient should undergo a repeat colonoscopy

## 2014-12-05 NOTE — Anesthesia Postprocedure Evaluation (Signed)
  Anesthesia Post-op Note  Patient: Janeth RaseVito M Prickett  Procedure(s) Performed: Procedure(s): COLONOSCOPY WITH PROPOFOL (N/A) ESOPHAGOGASTRODUODENOSCOPY (EGD) WITH PROPOFOL (N/A)  Patient Location: PACU  Anesthesia Type:MAC  Level of Consciousness: awake and alert   Airway and Oxygen Therapy: Patient Spontanous Breathing  Post-op Pain: none  Post-op Assessment: Post-op Vital signs reviewed, Patient's Cardiovascular Status Stable, Respiratory Function Stable, Patent Airway and No signs of Nausea or vomiting  Post-op Vital Signs: Reviewed and stable  Last Vitals:  Filed Vitals:   12/05/14 0701  BP: 214/131  Pulse: 66  Temp: 36.6 C  Resp: 10    Complications: No apparent anesthesia complications

## 2014-12-05 NOTE — Discharge Instructions (Signed)

## 2014-12-05 NOTE — H&P (Signed)
  Problem: Iron deficiency anemia. Hemoglobin 12.7 g. Serum iron saturation 16%.  History: The patient is a 64 year old male born 10-Feb-1951. He has unexplained iron deficiency anemia based on a slightly low hemoglobin and serum iron saturation. He does not take nonsteroidal anti-inflammatory medication on a daily basis. He does take indomethacin for gout attacks. Approximately 2 years ago, the patient developed vomiting and abdominal discomfort which resolved after being placed on Prilosec for 30 days. He denies gastrointestinal bleeding.  The patient is scheduled to undergo diagnostic esophagogastroduodenoscopy with screening for celiac disease and H. pylori gastritis followed by diagnostic colonoscopy.  Past medical history: Gout. Benign prostatic hypertrophy. Systolic hypertension. Kidney stones.  Exam: The patient is alert and lying comfortably on the endoscopy stretcher. Abdomen is soft and nontender to palpation. Lungs are clear to auscultation. Cardiac exam reveals a regular rhythm.  Plan: Proceed with diagnostic esophagogastroduodenoscopy and colonoscopy to evaluate iron deficiency anemia.

## 2014-12-05 NOTE — Transfer of Care (Signed)
Immediate Anesthesia Transfer of Care Note  Patient: Fernando Jordan  Procedure(s) Performed: Procedure(s): COLONOSCOPY WITH PROPOFOL (N/A) ESOPHAGOGASTRODUODENOSCOPY (EGD) WITH PROPOFOL (N/A)  Patient Location: PACU  Anesthesia Type:MAC  Level of Consciousness:  sedated, patient cooperative and responds to stimulation  Airway & Oxygen Therapy:Patient Spontanous Breathing and Patient connected to face mask oxgen  Post-op Assessment:  Report given to PACU RN and Post -op Vital signs reviewed and stable  Post vital signs:  Reviewed and stable  Last Vitals:  Filed Vitals:   12/05/14 0800  BP: 123/65  Pulse: 79  Temp: 36.6 C  Resp: 15    Complications: No apparent anesthesia complications

## 2014-12-06 ENCOUNTER — Encounter (HOSPITAL_COMMUNITY): Payer: Self-pay | Admitting: Gastroenterology

## 2015-01-12 DIAGNOSIS — B9681 Helicobacter pylori [H. pylori] as the cause of diseases classified elsewhere: Secondary | ICD-10-CM | POA: Diagnosis not present

## 2015-03-23 DIAGNOSIS — B9681 Helicobacter pylori [H. pylori] as the cause of diseases classified elsewhere: Secondary | ICD-10-CM | POA: Diagnosis not present

## 2015-05-10 DIAGNOSIS — Z0000 Encounter for general adult medical examination without abnormal findings: Secondary | ICD-10-CM | POA: Diagnosis not present

## 2015-05-10 DIAGNOSIS — M109 Gout, unspecified: Secondary | ICD-10-CM | POA: Diagnosis not present

## 2015-05-10 DIAGNOSIS — E559 Vitamin D deficiency, unspecified: Secondary | ICD-10-CM | POA: Diagnosis not present

## 2015-05-10 DIAGNOSIS — D509 Iron deficiency anemia, unspecified: Secondary | ICD-10-CM | POA: Diagnosis not present

## 2015-05-10 DIAGNOSIS — I10 Essential (primary) hypertension: Secondary | ICD-10-CM | POA: Diagnosis not present

## 2015-05-10 DIAGNOSIS — Z23 Encounter for immunization: Secondary | ICD-10-CM | POA: Diagnosis not present

## 2015-07-16 HISTORY — PX: EXTRACORPOREAL SHOCK WAVE LITHOTRIPSY: SHX1557

## 2015-08-24 ENCOUNTER — Other Ambulatory Visit: Payer: Self-pay | Admitting: Internal Medicine

## 2015-08-24 ENCOUNTER — Ambulatory Visit
Admission: RE | Admit: 2015-08-24 | Discharge: 2015-08-24 | Disposition: A | Discharge status: Home / Self Care | Payer: Medicare Other | Source: Ambulatory Visit | Attending: Nurse Practitioner | Admitting: Nurse Practitioner

## 2015-08-24 ENCOUNTER — Other Ambulatory Visit: Payer: Self-pay | Admitting: Nurse Practitioner

## 2015-08-24 DIAGNOSIS — R10A0 Flank pain, unspecified side: Secondary | ICD-10-CM

## 2015-08-24 DIAGNOSIS — R109 Unspecified abdominal pain: Secondary | ICD-10-CM

## 2015-10-10 ENCOUNTER — Other Ambulatory Visit: Payer: Self-pay | Admitting: Urology

## 2015-10-20 ENCOUNTER — Encounter (HOSPITAL_COMMUNITY): Payer: Self-pay | Admitting: *Deleted

## 2015-10-22 NOTE — H&P (Signed)
  History of Present Illness     The patient is a 65 year old gentleman who presents for evaluation of his left distal ureteral calculus. It is 5 mm in width and 7 mm in length. This pain started 2 days ago. He also had nausea and vomiting that has since resolved. He has been taking Flomax and prescription pain meds which has temporize this pain. He denies any fevers or chills. He has had 2 stones in the past. He passed both spontaneously. His last was 15 years ago.     The patient has continued medical expulsive therapy for approximately 6 weeks now. However his stone is unchanged on KUB.   Past Medical History Problems  1. History of nephrolithiasis (Z61.096(Z87.442)  Surgical History Problems  1. History of Surgery Excision Lipoma  Current Meds 1. Allopurinol 100 MG Oral Tablet;  Therapy: (Recorded:10Feb2017) to Recorded 2. Indomethacin 50 MG Oral Capsule;  Therapy: (Recorded:10Feb2017) to Recorded 3. Micardis HCT 40-12.5 MG Oral Tablet;  Therapy: (Recorded:10Feb2017) to Recorded 4. PriLOSEC 20 MG Oral Capsule Delayed Release;  Therapy: (Recorded:10Feb2017) to Recorded 5. Vitamin D TABS;  Therapy: (Recorded:10Feb2017) to Recorded  Allergies Medication  1. Demerol TABS  Family History Problems  1. No pertinent family history : Mother, Father  Social History Problems  1. Alcohol use (Z78.9) 2. Caffeine use (F15.90) 3. Father deceased 734. Former smoker [phone](Z87.891)   smoked fro 4 years/ quit 40 years ago 5. Mother deceased 476. Occupation   Self employed 7. Single  Review of Systems Genitourinary, constitutional, skin, eye, otolaryngeal, hematologic/lymphatic, cardiovascular, pulmonary, endocrine, musculoskeletal, gastrointestinal, neurological and psychiatric system(s) were reviewed and pertinent findings if present are noted and are otherwise negative.   Vitals Vital Signs  Height: 5 ft 8 in Weight: 175 lb  BMI Calculated: 26.61 BSA Calculated: 1.93 Blood Pressure: 167  / 92 Temperature: 96.9 F Heart Rate: 63  Physical Exam Constitutional: Well nourished . No acute distress.  ENT:. The ears and nose are normal in appearance.  Neck: The appearance of the neck is normal.  Pulmonary: No respiratory distress.  Cardiovascular:. No peripheral edema.  Abdomen: The abdomen is soft and nontender.  Skin: Normal skin turgor and no visible rash.  Neuro/Psych:. Mood and affect are appropriate. No focal sensory deficits.    Results/Data Urine  COLOR YELLOW  APPEARANCE CLEAR  SPECIFIC GRAVITY 1.020  pH 5.5  GLUCOSE NEGATIVE  BILIRUBIN NEGATIVE  KETONE NEGATIVE  BLOOD TRACE  PROTEIN NEGATIVE  NITRITE NEGATIVE  LEUKOCYTE ESTERASE NEGATIVE  SQUAMOUS EPITHELIAL/HPF 0-5 HPF WBC 0-5 WBC/HPF RBC 0-2 RBC/HPF BACTERIA NONE SEEN HPF CRYSTALS NONE SEEN HPF CASTS NONE SEEN LPF Yeast NONE SEEN HPF  KUB:  6 mm stone visualized in the same position in the distal left ureter. Normal bony process.   Assessment   I discussed the risks, benefits, and indications for treatment at this time as the patient is failed to progress on medical expulsive therapy. We discussed both ureteroscopy and lithotripsy. The patient has elected to undergo lithotripsy for management of the stone. He understands the risks, benefits, and indications of this procedure. He understands the risks include but are not limited to bleeding, infection, and repeat procedures. He has elected to proceed.   Plan      1. Left distal ureteral stone - unchanged in position from prior visit  -Left ESWL

## 2015-10-22 NOTE — Discharge Instructions (Signed)
Lithotripsy, Care After °Refer to this sheet in the next few weeks. These instructions provide you with information on caring for yourself after your procedure. Your health care provider may also give you more specific instructions. Your treatment has been planned according to current medical practices, but problems sometimes occur. Call your health care provider if you have any problems or questions after your procedure. °WHAT TO EXPECT AFTER THE PROCEDURE  °· Your urine may have a red tinge for a few days after treatment. Blood loss is usually minimal. °· You may have soreness in the back or flank area. This usually goes away after a few days. The procedure can cause blotches or bruises on the back where the pressure wave enters the skin. These marks usually cause only minimal discomfort and should disappear in a short time. °· Stone fragments should begin to pass within 24 hours of treatment. However, a delayed passage is not unusual. °· You may have pain, discomfort, and feel sick to your stomach (nauseated) when the crushed fragments of stone are passed down the tube from the kidney to the bladder. Stone fragments can pass soon after the procedure and may last for up to 4-8 weeks. °· A small number of patients may have severe pain when stone fragments are not able to pass, which leads to an obstruction. °· If your stone is greater than 1 inch (2.5 cm) in diameter or if you have multiple stones that have a combined diameter greater than 1 inch (2.5 cm), you may require more than one treatment. °· If you had a stent placed prior to your procedure, you may experience some discomfort, especially during urination. You may experience the pain or discomfort in your flank or back, or you may experience a sharp pain or discomfort at the base of your penis or in your lower abdomen. The discomfort usually lasts only a few minutes after urinating. °HOME CARE INSTRUCTIONS  °· Rest at home until you feel your energy  improving. °· Only take over-the-counter or prescription medicines for pain, discomfort, or fever as directed by your health care provider. Depending on the type of lithotripsy, you may need to take antibiotics and anti-inflammatory medicines for a few days. °· Drink enough water and fluids to keep your urine clear or pale yellow. This helps "flush" your kidneys. It helps pass any remaining pieces of stone and prevents stones from coming back. °· Most people can resume daily activities within 1-2 days after standard lithotripsy. It can take longer to recover from laser and percutaneous lithotripsy. °· Strain all urine through the provided strainer. Keep all particulate matter and stones for your health care provider to see. The stone may be as small as a grain of salt. It is very important to use the strainer each and every time you pass your urine. Any stones that are found can be sent to a medical lab for examination. °· Visit your health care provider for a follow-up appointment in a few weeks. Your doctor may remove your stent if you have one. Your health care provider will also check to see whether stone particles still remain. °SEEK MEDICAL CARE IF:  °· Your pain is not relieved by medicine. °· You have a lasting nauseous feeling. °· You feel there is too much blood in the urine. °· You develop persistent problems with frequent or painful urination that does not at least partially improve after 2 days following the procedure. °· You have a congested cough. °· You feel   lightheaded. °· You develop a rash or any other signs that might suggest an allergic problem. °· You develop any reaction or side effects to your medicine(s). °SEEK IMMEDIATE MEDICAL CARE IF:  °· You experience severe back or flank pain or both. °· You see nothing but blood when you urinate. °· You cannot pass any urine at all. °· You have a fever or shaking chills. °· You develop shortness of breath, difficulty breathing, or chest pain. °· You  develop vomiting that will not stop after 6-8 hours. °· You have a fainting episode. °  °This information is not intended to replace advice given to you by your health care provider. Make sure you discuss any questions you have with your health care provider. °  °Document Released: 07/21/2007 Document Revised: 03/22/2015 Document Reviewed: 01/14/2013 °Elsevier Interactive Patient Education ©2016 [employer] Inc. ° °

## 2015-10-23 ENCOUNTER — Ambulatory Visit (HOSPITAL_COMMUNITY)
Admission: RE | Admit: 2015-10-23 | Discharge: 2015-10-23 | Disposition: A | Discharge status: Home / Self Care | Payer: Medicare Other | Source: Ambulatory Visit | Attending: Urology | Admitting: Urology

## 2015-10-23 ENCOUNTER — Ambulatory Visit (HOSPITAL_COMMUNITY): Payer: Medicare Other

## 2015-10-23 ENCOUNTER — Encounter (HOSPITAL_COMMUNITY): Payer: Self-pay | Admitting: General Practice

## 2015-10-23 ENCOUNTER — Encounter (HOSPITAL_COMMUNITY)
Admission: RE | Disposition: A | Discharge status: Home / Self Care | Payer: Self-pay | Source: Ambulatory Visit | Attending: Urology

## 2015-10-23 DIAGNOSIS — N201 Calculus of ureter: Secondary | ICD-10-CM | POA: Diagnosis not present

## 2015-10-23 DIAGNOSIS — Z87442 Personal history of urinary calculi: Secondary | ICD-10-CM | POA: Insufficient documentation

## 2015-10-23 DIAGNOSIS — I10 Essential (primary) hypertension: Secondary | ICD-10-CM | POA: Insufficient documentation

## 2015-10-23 DIAGNOSIS — Z79899 Other long term (current) drug therapy: Secondary | ICD-10-CM | POA: Insufficient documentation

## 2015-10-23 HISTORY — DX: Chronic kidney disease, unspecified: N18.9

## 2015-10-23 SURGERY — LITHOTRIPSY, ESWL
Anesthesia: LOCAL | Laterality: Left

## 2015-10-23 MED ORDER — DIPHENHYDRAMINE HCL 25 MG PO CAPS
25.0000 mg | ORAL_CAPSULE | ORAL | Status: AC
  Administered 2015-10-23: 25 mg via ORAL
  Filled 2015-10-23: qty 1

## 2015-10-23 MED ORDER — DIAZEPAM 5 MG PO TABS
10.0000 mg | ORAL_TABLET | ORAL | Status: AC
Start: 2015-10-23 — End: 2015-10-23
  Administered 2015-10-23: 10 mg via ORAL
  Filled 2015-10-23: qty 2

## 2015-10-23 MED ORDER — CEFAZOLIN SODIUM-DEXTROSE 2-4 GM/100ML-% IV SOLN
2.0000 g | INTRAVENOUS | Status: AC
  Administered 2015-10-23: 2 g via INTRAVENOUS
  Filled 2015-10-23 (×2): qty 100

## 2015-10-23 MED ORDER — TAMSULOSIN HCL 0.4 MG PO CAPS
0.4000 mg | ORAL_CAPSULE | Freq: Once | ORAL | Status: AC
  Administered 2015-10-23: 0.4 mg via ORAL
  Filled 2015-10-23: qty 1

## 2015-10-23 MED ORDER — SODIUM CHLORIDE 0.9 % IV SOLN
INTRAVENOUS | Status: DC
  Administered 2015-10-23: 09:00:00 via INTRAVENOUS

## 2015-10-23 MED ORDER — TAMSULOSIN HCL 0.4 MG PO CAPS: 0.4000 mg | ORAL_CAPSULE | ORAL | Status: DC

## 2015-10-23 MED ORDER — OXYCODONE HCL 10 MG PO TABS: 10.0000 mg | ORAL_TABLET | ORAL | Status: DC | PRN

## 2015-10-23 NOTE — Interval H&P Note (Signed)
History and Physical Interval Note:  10/23/2015 3:51 AM  Fernando Jordan  has presented today for surgery, with the diagnosis of LEFT URETERAL STONE  The various methods of treatment have been discussed with the patient and family. After consideration of risks, benefits and other options for treatment, the patient has consented to  Procedure(s): EXTRACORPOREAL SHOCK WAVE LITHOTRIPSY (ESWL) (Left) as a surgical intervention .  The patient's history has been reviewed, patient examined, no change in status, stable for surgery.  I have reviewed the patient's chart and labs.  Questions were answered to the patient's satisfaction.     Garnett FarmTTELIN,Cherril Hett C

## 2015-10-23 NOTE — Op Note (Signed)
See [facility] OP note scanned into chart. Also because of the size, density, location and other factors that cannot be anticipated I feel this will likely be a staged procedure. This fact supersedes any indication in the scanned [facility] operative note to the contrary.  

## 2019-04-02 ENCOUNTER — Other Ambulatory Visit: Payer: Self-pay

## 2019-04-02 DIAGNOSIS — Z20822 Contact with and (suspected) exposure to covid-19: Secondary | ICD-10-CM

## 2019-04-04 LAB — NOVEL CORONAVIRUS, NAA: SARS-CoV-2, NAA: NOT DETECTED

## 2019-08-22 ENCOUNTER — Ambulatory Visit: Payer: Medicare Other | Attending: Internal Medicine

## 2019-08-22 DIAGNOSIS — Z23 Encounter for immunization: Secondary | ICD-10-CM

## 2019-08-22 NOTE — Progress Notes (Signed)
   Covid-19 Vaccination Clinic  Name:  Fernando Jordan    MRN: [id number] DOB: 12-Jul-1951  08/22/2019  Mr. [name] was observed post Covid-19 immunization for 15 minutes without incidence. He was provided with Vaccine Information Sheet and instruction to access the V-Safe system.   Mr. [name] was instructed to call 911 with any severe reactions post vaccine: Marland Kitchen Difficulty breathing  . Swelling of your face and throat  . A fast heartbeat  . A bad rash all over your body  . Dizziness and weakness    Immunizations Administered    Name Date Dose VIS Date Route   Pfizer COVID-19 Vaccine 08/22/2019  3:32 PM 0.3 mL 06/25/2019 Intramuscular   Manufacturer: [employer], [employer]   Lot: [id number]   NDC: [address]-8

## 2019-09-07 ENCOUNTER — Ambulatory Visit: Payer: Self-pay

## 2019-09-15 ENCOUNTER — Ambulatory Visit: Payer: Medicare Other | Attending: Internal Medicine

## 2019-09-15 ENCOUNTER — Ambulatory Visit: Payer: Medicare Other

## 2019-09-15 DIAGNOSIS — Z23 Encounter for immunization: Secondary | ICD-10-CM

## 2019-09-15 NOTE — Progress Notes (Signed)
   Covid-19 Vaccination Clinic  Name:  NESANEL AGUILA    MRN: [id number] DOB: Nov 16, 1950  09/15/2019  Mr. Soderquist was observed post Covid-19 immunization for 15 minutes without incident. He was provided with Vaccine Information Sheet and instruction to access the V-Safe system.   Mr. Merlo was instructed to call 911 with any severe reactions post vaccine: Marland Kitchen Difficulty breathing  . Swelling of face and throat  . A fast heartbeat  . A bad rash all over body  . Dizziness and weakness   Immunizations Administered    Name Date Dose VIS Date Route   Pfizer COVID-19 Vaccine 09/15/2019  3:15 PM 0.3 mL 06/25/2019 Intramuscular   Manufacturer: [employer], [employer]   Lot: [id number]   NDC: [address]-1

## 2020-08-14 DIAGNOSIS — I10 Essential (primary) hypertension: Secondary | ICD-10-CM | POA: Diagnosis not present

## 2020-08-14 DIAGNOSIS — D509 Iron deficiency anemia, unspecified: Secondary | ICD-10-CM | POA: Diagnosis not present

## 2020-08-14 DIAGNOSIS — E785 Hyperlipidemia, unspecified: Secondary | ICD-10-CM | POA: Diagnosis not present

## 2020-08-14 DIAGNOSIS — E782 Mixed hyperlipidemia: Secondary | ICD-10-CM | POA: Diagnosis not present

## 2020-09-14 DIAGNOSIS — E782 Mixed hyperlipidemia: Secondary | ICD-10-CM | POA: Diagnosis not present

## 2020-09-14 DIAGNOSIS — E785 Hyperlipidemia, unspecified: Secondary | ICD-10-CM | POA: Diagnosis not present

## 2020-09-14 DIAGNOSIS — D509 Iron deficiency anemia, unspecified: Secondary | ICD-10-CM | POA: Diagnosis not present

## 2020-09-14 DIAGNOSIS — I10 Essential (primary) hypertension: Secondary | ICD-10-CM | POA: Diagnosis not present

## 2020-09-22 DIAGNOSIS — Z20822 Contact with and (suspected) exposure to covid-19: Secondary | ICD-10-CM | POA: Diagnosis not present

## 2020-11-10 DIAGNOSIS — I10 Essential (primary) hypertension: Secondary | ICD-10-CM | POA: Diagnosis not present

## 2021-01-25 DIAGNOSIS — E785 Hyperlipidemia, unspecified: Secondary | ICD-10-CM | POA: Diagnosis not present

## 2021-01-25 DIAGNOSIS — E782 Mixed hyperlipidemia: Secondary | ICD-10-CM | POA: Diagnosis not present

## 2021-01-25 DIAGNOSIS — D509 Iron deficiency anemia, unspecified: Secondary | ICD-10-CM | POA: Diagnosis not present

## 2021-01-25 DIAGNOSIS — I10 Essential (primary) hypertension: Secondary | ICD-10-CM | POA: Diagnosis not present

## 2021-02-15 DIAGNOSIS — H2513 Age-related nuclear cataract, bilateral: Secondary | ICD-10-CM | POA: Diagnosis not present

## 2021-03-15 DIAGNOSIS — E785 Hyperlipidemia, unspecified: Secondary | ICD-10-CM | POA: Diagnosis not present

## 2021-03-15 DIAGNOSIS — Z0000 Encounter for general adult medical examination without abnormal findings: Secondary | ICD-10-CM | POA: Diagnosis not present

## 2021-03-15 DIAGNOSIS — Z1211 Encounter for screening for malignant neoplasm of colon: Secondary | ICD-10-CM | POA: Diagnosis not present

## 2021-03-15 DIAGNOSIS — M109 Gout, unspecified: Secondary | ICD-10-CM | POA: Diagnosis not present

## 2021-03-15 DIAGNOSIS — E559 Vitamin D deficiency, unspecified: Secondary | ICD-10-CM | POA: Diagnosis not present

## 2021-03-15 DIAGNOSIS — Z23 Encounter for immunization: Secondary | ICD-10-CM | POA: Diagnosis not present

## 2021-03-15 DIAGNOSIS — Z87442 Personal history of urinary calculi: Secondary | ICD-10-CM | POA: Diagnosis not present

## 2021-03-15 DIAGNOSIS — D509 Iron deficiency anemia, unspecified: Secondary | ICD-10-CM | POA: Diagnosis not present

## 2021-03-15 DIAGNOSIS — I10 Essential (primary) hypertension: Secondary | ICD-10-CM | POA: Diagnosis not present

## 2021-03-15 DIAGNOSIS — Z1389 Encounter for screening for other disorder: Secondary | ICD-10-CM | POA: Diagnosis not present

## 2021-03-15 DIAGNOSIS — J309 Allergic rhinitis, unspecified: Secondary | ICD-10-CM | POA: Diagnosis not present

## 2021-03-15 DIAGNOSIS — Z79899 Other long term (current) drug therapy: Secondary | ICD-10-CM | POA: Diagnosis not present

## 2021-05-14 DIAGNOSIS — I10 Essential (primary) hypertension: Secondary | ICD-10-CM | POA: Diagnosis not present

## 2021-06-13 DIAGNOSIS — I10 Essential (primary) hypertension: Secondary | ICD-10-CM | POA: Diagnosis not present

## 2021-09-24 ENCOUNTER — Ambulatory Visit
Admission: RE | Admit: 2021-09-24 | Discharge: 2021-09-24 | Disposition: A | Discharge status: Home / Self Care | Payer: Medicare Other | Source: Ambulatory Visit | Attending: Internal Medicine | Admitting: Internal Medicine

## 2021-09-24 ENCOUNTER — Other Ambulatory Visit: Payer: Self-pay | Admitting: Internal Medicine

## 2021-09-24 DIAGNOSIS — J189 Pneumonia, unspecified organism: Secondary | ICD-10-CM

## 2021-09-24 DIAGNOSIS — R059 Cough, unspecified: Secondary | ICD-10-CM | POA: Diagnosis not present

## 2021-10-01 DIAGNOSIS — Z09 Encounter for follow-up examination after completed treatment for conditions other than malignant neoplasm: Secondary | ICD-10-CM | POA: Diagnosis not present

## 2021-10-01 DIAGNOSIS — R5383 Other fatigue: Secondary | ICD-10-CM | POA: Diagnosis not present

## 2021-10-01 DIAGNOSIS — R059 Cough, unspecified: Secondary | ICD-10-CM | POA: Diagnosis not present

## 2022-03-13 DIAGNOSIS — H2513 Age-related nuclear cataract, bilateral: Secondary | ICD-10-CM | POA: Diagnosis not present

## 2022-05-01 ENCOUNTER — Other Ambulatory Visit: Payer: Self-pay | Admitting: Internal Medicine

## 2022-05-01 DIAGNOSIS — E559 Vitamin D deficiency, unspecified: Secondary | ICD-10-CM | POA: Diagnosis not present

## 2022-05-01 DIAGNOSIS — E782 Mixed hyperlipidemia: Secondary | ICD-10-CM

## 2022-05-01 DIAGNOSIS — N1831 Chronic kidney disease, stage 3a: Secondary | ICD-10-CM | POA: Diagnosis not present

## 2022-05-01 DIAGNOSIS — I10 Essential (primary) hypertension: Secondary | ICD-10-CM | POA: Diagnosis not present

## 2022-05-01 DIAGNOSIS — R7303 Prediabetes: Secondary | ICD-10-CM | POA: Diagnosis not present

## 2022-05-01 DIAGNOSIS — Z23 Encounter for immunization: Secondary | ICD-10-CM | POA: Diagnosis not present

## 2022-05-01 DIAGNOSIS — M109 Gout, unspecified: Secondary | ICD-10-CM | POA: Diagnosis not present

## 2022-05-01 DIAGNOSIS — Z0000 Encounter for general adult medical examination without abnormal findings: Secondary | ICD-10-CM | POA: Diagnosis not present

## 2022-06-24 ENCOUNTER — Ambulatory Visit
Admission: RE | Admit: 2022-06-24 | Discharge: 2022-06-24 | Disposition: A | Discharge status: Home / Self Care | Payer: No Typology Code available for payment source | Source: Ambulatory Visit | Attending: Internal Medicine | Admitting: Internal Medicine

## 2022-06-24 DIAGNOSIS — I700 Atherosclerosis of aorta: Secondary | ICD-10-CM | POA: Diagnosis not present

## 2022-06-24 DIAGNOSIS — E782 Mixed hyperlipidemia: Secondary | ICD-10-CM

## 2023-01-27 ENCOUNTER — Other Ambulatory Visit (HOSPITAL_COMMUNITY): Payer: Self-pay | Admitting: Urology

## 2023-01-27 DIAGNOSIS — C61 Malignant neoplasm of prostate: Secondary | ICD-10-CM

## 2023-02-14 ENCOUNTER — Encounter (HOSPITAL_COMMUNITY)
Admission: RE | Admit: 2023-02-14 | Discharge: 2023-02-14 | Disposition: A | Discharge status: Home / Self Care | Payer: Medicare Other | Source: Ambulatory Visit | Attending: Urology | Admitting: Urology

## 2023-02-14 DIAGNOSIS — C61 Malignant neoplasm of prostate: Secondary | ICD-10-CM | POA: Insufficient documentation

## 2023-02-14 MED ORDER — PIFLIFOLASTAT F 18 (PYLARIFY) INJECTION
9.0000 | Freq: Once | INTRAVENOUS | Status: AC
  Administered 2023-02-14: 8.6 via INTRAVENOUS

## 2023-03-07 ENCOUNTER — Telehealth: Payer: Self-pay | Admitting: Radiation Oncology

## 2023-03-07 NOTE — Telephone Encounter (Signed)
8/23 @ 11:04 am Left voicemail for patient to call our office to be schedule for consult

## 2023-03-10 ENCOUNTER — Telehealth: Payer: Self-pay | Admitting: Radiation Oncology

## 2023-03-10 NOTE — Telephone Encounter (Signed)
8/26 @ 11:54 am Received inbasket from Jorene Guest concerning this patient left voicemail with questions about his upcoming appointment with Dr. Kathrynn Running.  Called patient back and left voicemail for patient to call us back.

## 2023-03-14 DIAGNOSIS — H35411 Lattice degeneration of retina, right eye: Secondary | ICD-10-CM | POA: Diagnosis not present

## 2023-03-24 ENCOUNTER — Encounter: Payer: Self-pay | Admitting: Radiation Oncology

## 2023-03-24 NOTE — Progress Notes (Signed)
GU Location of Tumor / Histology: Prostate Ca  If Prostate Cancer, Gleason Score is (4 + 3) and PSA is (5.3 10/2022)  PSA 4.5 on 04/2022  Biopsies      02/14/2023 Dr. Jettie Pagan NM PET (PSMA) Skull To Mid Thigh CLINICAL DATA: Prostate cancer. PSA equal 5.3    IMPRESSION: 1. No evidence of metastatic adenopathy in the pelvis or periaortic retroperitoneum. 2. No evidence of visceral metastasis or skeletal metastasis. 3. Enlarged prostate gland with nonspecific scattered mild radiotracer activity. 4. Atrophic LEFT kidney   Past/Anticipated interventions by urology, if any: NA  Past/Anticipated interventions by medical oncology, if any: NA  Weight changes, if any: {:18581}  IPSS: SHIM:  Bowel/Bladder complaints, if any: {:18581}   Nausea/Vomiting, if any: {:18581}  Pain issues, if any:  {:18581}  SAFETY ISSUES: Prior radiation? {:18581} Pacemaker/ICD? {:18581} Possible current pregnancy? Male Is the patient on methotrexate? No  Current Complaints / other details:

## 2023-03-25 ENCOUNTER — Ambulatory Visit
Admission: RE | Admit: 2023-03-25 | Discharge: 2023-03-25 | Disposition: A | Discharge status: Home / Self Care | Payer: Medicare Other | Source: Ambulatory Visit | Attending: Radiation Oncology | Admitting: Radiation Oncology

## 2023-03-25 ENCOUNTER — Encounter: Payer: Self-pay | Admitting: Radiation Oncology

## 2023-03-25 ENCOUNTER — Encounter: Payer: Self-pay | Admitting: Urology

## 2023-03-25 VITALS — BP 134/76 | HR 84 | Temp 97.3°F | Resp 20 | Ht 68.0 in | Wt 185.6 lb

## 2023-03-25 DIAGNOSIS — Z87891 Personal history of nicotine dependence: Secondary | ICD-10-CM | POA: Insufficient documentation

## 2023-03-25 DIAGNOSIS — N189 Chronic kidney disease, unspecified: Secondary | ICD-10-CM | POA: Diagnosis not present

## 2023-03-25 DIAGNOSIS — C61 Malignant neoplasm of prostate: Secondary | ICD-10-CM

## 2023-03-25 DIAGNOSIS — Z191 Hormone sensitive malignancy status: Secondary | ICD-10-CM | POA: Diagnosis not present

## 2023-03-25 DIAGNOSIS — Z87442 Personal history of urinary calculi: Secondary | ICD-10-CM | POA: Insufficient documentation

## 2023-03-25 DIAGNOSIS — Z79899 Other long term (current) drug therapy: Secondary | ICD-10-CM | POA: Diagnosis not present

## 2023-03-25 DIAGNOSIS — Z7984 Long term (current) use of oral hypoglycemic drugs: Secondary | ICD-10-CM | POA: Diagnosis not present

## 2023-03-25 NOTE — Progress Notes (Signed)
Introduced myself to the patient as the prostate nurse navigator.  No barriers to care identified at this time.  He is here to discuss his radiation treatment options.  I gave him my business card and asked him to call me with questions or concerns.  Verbalized understanding.  ?

## 2023-03-25 NOTE — Progress Notes (Signed)
Radiation Oncology         [phone] ________________________________  Initial Outpatient Consultation  Name: Fernando Jordan MRN: [id number]  Date: 03/25/2023  DOB: [birth year]  ON:[id number], Sherie Don, MD  Jannifer Hick, MD   REFERRING PHYSICIAN: Jannifer Hick, MD  DIAGNOSIS: 72 y.o. gentleman with Stage T2a adenocarcinoma of the prostate with Gleason score of 4+3, and PSA of 5.3.    ICD-10-CM   1. Malignant neoplasm of prostate (HCC)  C61       HISTORY OF PRESENT ILLNESS: Fernando Jordan is a 72 y.o. male with a diagnosis of prostate cancer. He was noted to have an elevated PSA of 4.45 on routine labs in 04/2022 with his primary care physician, Dr. Orson Aloe. A repeat PSA remained elevated at 5.3 in 10/2022 so, he was referred for evaluation in urology by Dr. Cardell Peach on 11/18/22. Digital rectal examination performed at that time showed a firm irregular nodule on right aspect of prostate. The patient proceeded to transrectal ultrasound with 12 biopsies of the prostate on 01/13/23.  The prostate volume measured 120 cc.  Out of 12 core biopsies, 3 were positive.  The maximum Gleason score was 4+3, and this was seen in the right base (with perineural invasion), right base lateral, and right mid lateral.  He underwent staging PSMA PET scan on 02/14/23 showing no evidence of disease outside of the prostate.  The patient reviewed the biopsy and imaging results with his urologist and he has kindly been referred today for discussion of potential radiation treatment options. He is accompanied by his significant other, [name], for today's visit.   PREVIOUS RADIATION THERAPY: No  PAST MEDICAL HISTORY:  Past Medical History:  Diagnosis Date   Chronic kidney disease 07/15/1985   kidney stones   Elevated PSA    Gout    History of kidney stones       PAST SURGICAL HISTORY: Past Surgical History:  Procedure Laterality Date   COLONOSCOPY WITH PROPOFOL N/A 12/05/2014   Procedure: COLONOSCOPY WITH  PROPOFOL;  Surgeon: Charolett Bumpers, MD;  Location: [facility];  Service: Endoscopy;  Laterality: N/A;   ESOPHAGOGASTRODUODENOSCOPY (EGD) WITH PROPOFOL N/A 12/05/2014   Procedure: ESOPHAGOGASTRODUODENOSCOPY (EGD) WITH PROPOFOL;  Surgeon: Charolett Bumpers, MD;  Location: [facility];  Service: Endoscopy;  Laterality: N/A;   EXTRACORPOREAL SHOCK WAVE LITHOTRIPSY  2017   PROSTATE BIOPSY     TONSILLECTOMY      FAMILY HISTORY: History reviewed. No pertinent family history.  SOCIAL HISTORY:  Social History   Socioeconomic History   Marital status: Legally Separated    Spouse name: Not on file   Number of children: Not on file   Years of education: Not on file   Highest education level: Not on file  Occupational History   Not on file  Tobacco Use   Smoking status: Former    Current packs/day: 0.00    Types: Cigarettes, Cigars    Start date: 11/28/1966    Quit date: 11/28/1970    Years since quitting: 52.3   Smokeless tobacco: Not on file  Substance and Sexual Activity   Alcohol use: Yes    Alcohol/week: 2.0 standard drinks of alcohol    Types: 2 Glasses of wine per week    Comment: ocassionally   Drug use: No   Sexual activity: Never  Other Topics Concern   Not on file  Social History Narrative   Not on file   Social Determinants of Health   Financial  Resource Strain: Not on file  Food Insecurity: No Food Insecurity (03/25/2023)   Hunger Vital Sign    Worried About Running Out of Food in the Last Year: Never true    Ran Out of Food in the Last Year: Never true  Transportation Needs: No Transportation Needs (03/25/2023)   PRAPARE - Administrator, Civil Service (Medical): No    Lack of Transportation (Non-Medical): No  Physical Activity: Not on file  Stress: Not on file  Social Connections: Not on file  Intimate Partner Violence: Not At Risk (03/25/2023)   Humiliation, Afraid, Rape, and Kick questionnaire    Fear of Current or Ex-Partner: No    Emotionally  Abused: No    Physically Abused: No    Sexually Abused: No    ALLERGIES: Demerol [meperidine] and Moxifloxacin  MEDICATIONS:  Current Outpatient Medications  Medication Sig Dispense Refill   amLODipine (NORVASC) 5 MG tablet Take 5 mg by mouth daily.     atorvastatin (LIPITOR) 10 MG tablet Take 10 mg by mouth daily.     hydrochlorothiazide (MICROZIDE) 12.5 MG capsule Take 12.5 mg by mouth daily.     PREVIDENT 5000 ENAMEL PROTECT 1.1-5 % GEL Place onto teeth.     valsartan (DIOVAN) 160 MG tablet Take 160 mg by mouth daily.     allopurinol (ZYLOPRIM) 300 MG tablet Take 1 tablet (300 mg total) by mouth daily. 30 tablet 2   indomethacin (INDOCIN) 50 MG capsule Take 1 capsule (50 mg total) by mouth 3 (three) times daily with meals. 21 capsule 11   metFORMIN (GLUCOPHAGE-XR) 500 MG 24 hr tablet 1 tablet Orally twice daily for 90 days     tamsulosin (FLOMAX) 0.4 MG CAPS capsule Take 0.4 mg by mouth.     No current facility-administered medications for this encounter.    REVIEW OF SYSTEMS:  On review of systems, the patient reports that he is doing well overall. He denies any chest pain, shortness of breath, cough, fevers, chills, night sweats, unintended weight changes. He denies any bowel disturbances, and denies abdominal pain, nausea or vomiting. He denies any new musculoskeletal or joint aches or pains. His IPSS was 10, indicating mild-moderate urinary symptoms with nocturia x2/night, weak stream and intermittency but he denies straining or difficulty emptying his bladder. His SHIM was 21, indicating he has mild erectile dysfunction. A complete review of systems is obtained and is otherwise negative.    PHYSICAL EXAM:  Wt Readings from Last 3 Encounters:  03/25/23 185 lb 9.6 oz (84.2 kg)  10/23/15 175 lb 6.4 oz (79.6 kg)  12/05/14 170 lb (77.1 kg)   Temp Readings from Last 3 Encounters:  03/25/23 (!) 97.3 F (36.3 C) (Oral)  10/23/15 97.3 F (36.3 C) (Oral)  12/05/14 97.9 F (36.6 C)  (Oral)   BP Readings from Last 3 Encounters:  03/25/23 134/76  10/23/15 (!) 161/93  12/05/14 123/65   Pulse Readings from Last 3 Encounters:  03/25/23 84  10/23/15 64  12/05/14 79   Pain Assessment Pain Score: 0-No pain/10  In general this is a well appearing Caucasian male in no acute distress. He's alert and oriented x4 and appropriate throughout the examination. Cardiopulmonary assessment is negative for acute distress, and he exhibits normal effort.     KPS = 100  100 - Normal; no complaints; no evidence of disease. 90   - Able to carry on normal activity; minor signs or symptoms of disease. 80   - Normal activity with  effort; some signs or symptoms of disease. 67   - Cares for self; unable to carry on normal activity or to do active work. 60   - Requires occasional assistance, but is able to care for most of his personal needs. 50   - Requires considerable assistance and frequent medical care. 40   - Disabled; requires special care and assistance. 30   - Severely disabled; hospital admission is indicated although death not imminent. 20   - Very sick; hospital admission necessary; active supportive treatment necessary. 10   - Moribund; fatal processes progressing rapidly. 0     - Dead  Karnofsky DA, Abelmann WH, Craver LS and Burchenal JH [phone]) The use of the nitrogen mustards in the palliative treatment of carcinoma: with particular reference to bronchogenic carcinoma Cancer 1 634-56  LABORATORY DATA:  No results found for: "WBC", "HGB", "HCT", "MCV", "PLT" Lab Results  Component Value Date   NA 139 01/17/2013   K 4.2 01/17/2013   CL 105 01/17/2013   CO2 25 01/17/2013   Lab Results  Component Value Date   ALT 9 01/17/2013   AST 13 01/17/2013   ALKPHOS 61 01/17/2013   BILITOT 0.6 01/17/2013     RADIOGRAPHY: No results found.    IMPRESSION/PLAN: 1. 72 y.o. gentleman with Stage T2a adenocarcinoma of the prostate with Gleason Score of 4+3, and PSA of 5.3. We  discussed the patient's workup and outlined the nature of prostate cancer in this setting. The patient's T stage, Gleason's score, and PSA put him into the unfavorable intermediate risk group. Accordingly, he is eligible for a variety of potential treatment options including brachytherapy, 5.5 weeks of external radiation concurrent with ST-ADT, or prostatectomy. We discussed the available radiation techniques, and focused on the details and logistics of delivery. The patient is not a candidate for brachytherapy with a prostate volume of 120 cc. Therefore, we discussed and outlined the risks, benefits, short and long-term effects associated with daily external beam radiotherapy and compared and contrasted these with prostatectomy. We discussed the role of SpaceOAR gel in reducing the rectal toxicity associated with radiotherapy.  We also detailed the role of ADT in the treatment of unfavorable intermediate risk prostate cancer and outlined the associated side effects that could be expected with this therapy. He was encouraged to ask questions that were answered to his stated satisfaction.  At the conclusion of our conversation, the patient is interested in moving forward with 5.5 weeks of external beam therapy concurrent with ADT. We will share our discussion with Dr. Cardell Peach and make arrangements for a follow up visit in the urology office, first available, to start ADT now. We will also coordinate for fiducial markers and SpaceOAR gel placement in late October 2024, once they return from [place]. The patient appears to have a good understanding of his disease and our treatment recommendations which are of curative intent and is in agreement with the stated plan.  Therefore, we will move forward with treatment planning accordingly, in anticipation of beginning IMRT approximately 2 months after starting ADT. We enjoyed meeting him and [name] today and look forward to continuing to participate in his care. .  We  personally spent 70 minutes in this encounter including chart review, reviewing radiological studies, meeting face-to-face with the patient, entering orders and completing documentation.    Marguarite Arbour, PA-C    Margaretmary Dys, MD  [facility]  Radiation Oncology Direct Dial: [phone]  Fax: [phone] .com  Skype  LinkedIn  This document serves as a record of services personally performed by Margaretmary Dys, MD and Marcello Fennel, PA-C. It was created on their behalf by Mickie Bail, a trained medical scribe. The creation of this record is based on the scribe's personal observations and the provider's statements to them. This document has been checked and approved by the attending provider.

## 2023-03-26 ENCOUNTER — Telehealth: Payer: Self-pay | Admitting: *Deleted

## 2023-03-26 NOTE — Telephone Encounter (Signed)
CALLED PATIENT TO INFORM OF ADT APPT. ON 04-09-23- ARRIVAL TIME- 8:30 AM @ DR. Dillard Essex OFFICE, SPOKE WITH PATIENT AND MAILED HIM AN APPT. CARD

## 2023-04-11 ENCOUNTER — Inpatient Hospital Stay: Payer: Medicare Other | Admitting: General Practice

## 2023-04-11 NOTE — Progress Notes (Signed)
Patient received Eligard 45mg  on 9/25.  Pending fiducial's and spaceOAR at this time.   Plan of care in progress.

## 2023-04-17 ENCOUNTER — Other Ambulatory Visit: Payer: Self-pay | Admitting: Urology

## 2023-04-25 NOTE — Progress Notes (Signed)
RN left message for call back.  

## 2023-04-28 ENCOUNTER — Encounter: Payer: Medicare Other | Admitting: General Practice

## 2023-05-09 ENCOUNTER — Inpatient Hospital Stay: Payer: Medicare Other | Attending: Internal Medicine | Admitting: General Practice

## 2023-05-09 NOTE — Progress Notes (Signed)
[facility] Advance Directives Clinic  Assisted Mr Dietert in completing his Advance Directives today.  He chooses Venetia Constable of [place] [phone]) as his health care agent, with Margaretha Seeds of [place] [phone]) as next choice.  He indicates that he wants organ donation if possible.  In the Living Will Mr Zani initials all three conditions on item 1, indicating not to prolong his life in the situations of incurable condition that will result in death, unconsciousness without likelihood of regaining consciousness, and advanced dementia that is unlikely to improve.  In the event that the health care agent gives instructions that differ from those in the Living Will, Mr Dorson instructs to follow the health care agent.  Original and copies to Mr Semelsberger; copy to Health Information Management to be scanned into Electronic Medical Record.   [address] [place], [place], [facility] Pager [phone] Voicemail [phone]

## 2023-05-13 NOTE — Progress Notes (Signed)
RN left message with direct number to assess any questions or needs prior to upcoming radiation treatment.

## 2023-05-14 ENCOUNTER — Encounter (HOSPITAL_BASED_OUTPATIENT_CLINIC_OR_DEPARTMENT_OTHER): Payer: Self-pay | Admitting: Urology

## 2023-05-14 ENCOUNTER — Other Ambulatory Visit: Payer: Self-pay

## 2023-05-14 DIAGNOSIS — E559 Vitamin D deficiency, unspecified: Secondary | ICD-10-CM | POA: Diagnosis not present

## 2023-05-14 DIAGNOSIS — I10 Essential (primary) hypertension: Secondary | ICD-10-CM | POA: Diagnosis not present

## 2023-05-14 DIAGNOSIS — Z0000 Encounter for general adult medical examination without abnormal findings: Secondary | ICD-10-CM | POA: Diagnosis not present

## 2023-05-14 DIAGNOSIS — N1831 Chronic kidney disease, stage 3a: Secondary | ICD-10-CM | POA: Diagnosis not present

## 2023-05-14 DIAGNOSIS — E782 Mixed hyperlipidemia: Secondary | ICD-10-CM | POA: Diagnosis not present

## 2023-05-14 DIAGNOSIS — E119 Type 2 diabetes mellitus without complications: Secondary | ICD-10-CM | POA: Diagnosis not present

## 2023-05-14 DIAGNOSIS — M109 Gout, unspecified: Secondary | ICD-10-CM | POA: Diagnosis not present

## 2023-05-14 DIAGNOSIS — I700 Atherosclerosis of aorta: Secondary | ICD-10-CM | POA: Diagnosis not present

## 2023-05-14 NOTE — Progress Notes (Signed)
Spoke w/ via phone for pre-op interview: patient  Lab needs dos: EKG and I-stat Lab results: NA COVID test: patient states asymptomatic no test needed. Arrive at 0830 NPO after MN except clear liquids. Clear liquids from MN until 0730 Med rec completed. Medications to take morning of surgery: amlodipine only; hold turmeric until post procedure Diabetic medication: none morning of sx Patient instructed no nail polish to be worn day of surgery. Patient instructed to bring photo id and insurance card day of surgery. Patient aware to have driver (ride ) / caregiver for 24 hours after surgery. Significant other, [name], to drive. Special Instructions: NA Patient verbalized understanding of instructions that were given at this phone interview. Patient denies shortness of breath, chest pain, fever, cough at this phone interview.

## 2023-05-19 DIAGNOSIS — Z191 Hormone sensitive malignancy status: Secondary | ICD-10-CM | POA: Diagnosis not present

## 2023-05-19 NOTE — Progress Notes (Signed)
  Radiation Oncology         [phone] ________________________________  Name: MANDELA BELLO MRN: [id number]  Date: 05/23/2023  DOB: [birth year]  SIMULATION AND TREATMENT PLANNING NOTE    ICD-10-CM   1. Malignant neoplasm of prostate (HCC)  C61       DIAGNOSIS:  72 y.o. gentleman with Stage T2a adenocarcinoma of the prostate with Gleason score of 4+3, and PSA of 5.3.  NARRATIVE:  The patient was brought to the CT Simulation planning suite.  Identity was confirmed.  All relevant records and images related to the planned course of therapy were reviewed.  The patient freely provided informed written consent to proceed with treatment after reviewing the details related to the planned course of therapy. The consent form was witnessed and verified by the simulation staff.  Then, the patient was set-up in a stable reproducible supine position for radiation therapy.  A vacuum lock pillow device was custom fabricated to position his legs in a reproducible immobilized position.  Then, supervised the performance of a urethrogram under sterile conditions to identify the prostatic apex.  CT images were obtained.  Surface markings were placed.  The CT images were loaded into the planning software.  Then the prostate target and avoidance structures including the rectum, bladder, bowel and hips were contoured.  Treatment planning then occurred.  The radiation prescription was entered and confirmed.  A total of one complex treatment devices was fabricated. I have requested : Intensity Modulated Radiotherapy (IMRT) is medically necessary for this case for the following reason:  Rectal sparing.  I have requested daily cone beam CT volumetric image gudiance to track gold fiducial posiitoning along with bladder and rectal filling, this is medically necessary to assure accurate positioning of high dose radiation.  PLAN:  The patient will receive 70 Gy in 28 fractions.  ________________________________  Artist Pais  Kathrynn Running, M.D.

## 2023-05-20 ENCOUNTER — Telehealth: Payer: Self-pay | Admitting: *Deleted

## 2023-05-20 ENCOUNTER — Ambulatory Visit (HOSPITAL_BASED_OUTPATIENT_CLINIC_OR_DEPARTMENT_OTHER)
Admission: RE | Admit: 2023-05-20 | Discharge: 2023-05-20 | Disposition: A | Discharge status: Home / Self Care | Payer: Medicare Other | Attending: Urology | Admitting: Urology

## 2023-05-20 ENCOUNTER — Ambulatory Visit (HOSPITAL_BASED_OUTPATIENT_CLINIC_OR_DEPARTMENT_OTHER): Payer: Medicare Other | Admitting: Anesthesiology

## 2023-05-20 ENCOUNTER — Encounter (HOSPITAL_BASED_OUTPATIENT_CLINIC_OR_DEPARTMENT_OTHER): Payer: Self-pay | Admitting: Urology

## 2023-05-20 ENCOUNTER — Encounter (HOSPITAL_BASED_OUTPATIENT_CLINIC_OR_DEPARTMENT_OTHER)
Admission: RE | Disposition: A | Discharge status: Home / Self Care | Payer: Self-pay | Source: Home / Self Care | Attending: Urology

## 2023-05-20 ENCOUNTER — Other Ambulatory Visit: Payer: Self-pay

## 2023-05-20 DIAGNOSIS — C61 Malignant neoplasm of prostate: Secondary | ICD-10-CM | POA: Diagnosis present

## 2023-05-20 DIAGNOSIS — E1122 Type 2 diabetes mellitus with diabetic chronic kidney disease: Secondary | ICD-10-CM | POA: Insufficient documentation

## 2023-05-20 DIAGNOSIS — I129 Hypertensive chronic kidney disease with stage 1 through stage 4 chronic kidney disease, or unspecified chronic kidney disease: Secondary | ICD-10-CM | POA: Insufficient documentation

## 2023-05-20 DIAGNOSIS — N189 Chronic kidney disease, unspecified: Secondary | ICD-10-CM | POA: Diagnosis not present

## 2023-05-20 DIAGNOSIS — Z01818 Encounter for other preprocedural examination: Secondary | ICD-10-CM

## 2023-05-20 DIAGNOSIS — Z87891 Personal history of nicotine dependence: Secondary | ICD-10-CM | POA: Insufficient documentation

## 2023-05-20 DIAGNOSIS — I10 Essential (primary) hypertension: Secondary | ICD-10-CM

## 2023-05-20 HISTORY — DX: Essential (primary) hypertension: I10

## 2023-05-20 HISTORY — PX: SPACE OAR INSTILLATION: SHX6769

## 2023-05-20 HISTORY — DX: Type 2 diabetes mellitus without complications: E11.9

## 2023-05-20 HISTORY — PX: GOLD SEED IMPLANT: SHX6343

## 2023-05-20 LAB — POCT I-STAT, CHEM 8
BUN: 34 mg/dL — ABNORMAL HIGH (ref 8–23)
Calcium, Ion: 1.26 mmol/L (ref 1.15–1.40)
Chloride: 106 mmol/L (ref 98–111)
Creatinine, Ser: 1.2 mg/dL (ref 0.61–1.24)
Glucose, Bld: 148 mg/dL — ABNORMAL HIGH (ref 70–99)
HCT: 46 % (ref 39.0–52.0)
Hemoglobin: 15.6 g/dL (ref 13.0–17.0)
Potassium: 4.6 mmol/L (ref 3.5–5.1)
Sodium: 142 mmol/L (ref 135–145)
TCO2: 27 mmol/L (ref 22–32)

## 2023-05-20 LAB — GLUCOSE, CAPILLARY: Glucose-Capillary: 167 mg/dL — ABNORMAL HIGH (ref 70–99)

## 2023-05-20 SURGERY — INSERTION, GOLD SEEDS
Anesthesia: Monitor Anesthesia Care

## 2023-05-20 MED ORDER — DEXMEDETOMIDINE HCL IN NACL 80 MCG/20ML IV SOLN
INTRAVENOUS | Status: DC | PRN
  Administered 2023-05-20: 8 ug via INTRAVENOUS

## 2023-05-20 MED ORDER — OXYCODONE HCL 5 MG/5ML PO SOLN: 5.0000 mg | Freq: Once | ORAL | Status: DC | PRN

## 2023-05-20 MED ORDER — OXYCODONE HCL 5 MG PO TABS: 5.0000 mg | ORAL_TABLET | Freq: Once | ORAL | Status: DC | PRN

## 2023-05-20 MED ORDER — CEFAZOLIN SODIUM-DEXTROSE 2-4 GM/100ML-% IV SOLN
2.0000 g | INTRAVENOUS | Status: AC
  Administered 2023-05-20: 2 g via INTRAVENOUS

## 2023-05-20 MED ORDER — SODIUM CHLORIDE (PF) 0.9 % IJ SOLN
INTRAMUSCULAR | Status: DC | PRN
  Administered 2023-05-20: 10 mL

## 2023-05-20 MED ORDER — LACTATED RINGERS IV SOLN: INTRAVENOUS | Status: DC

## 2023-05-20 MED ORDER — CEFAZOLIN SODIUM-DEXTROSE 2-4 GM/100ML-% IV SOLN
INTRAVENOUS | Status: AC
  Filled 2023-05-20: qty 100

## 2023-05-20 MED ORDER — FENTANYL CITRATE (PF) 100 MCG/2ML IJ SOLN: 25.0000 ug | INTRAMUSCULAR | Status: DC | PRN

## 2023-05-20 MED ORDER — ONDANSETRON HCL 4 MG/2ML IJ SOLN: 4.0000 mg | Freq: Once | INTRAMUSCULAR | Status: DC | PRN

## 2023-05-20 MED ORDER — ACETAMINOPHEN 10 MG/ML IV SOLN: 1000.0000 mg | Freq: Once | INTRAVENOUS | Status: DC | PRN

## 2023-05-20 MED ORDER — PROPOFOL 500 MG/50ML IV EMUL
INTRAVENOUS | Status: DC | PRN
  Administered 2023-05-20: 30 mg via INTRAVENOUS
  Administered 2023-05-20: 200 ug/kg/min via INTRAVENOUS

## 2023-05-20 MED ORDER — BUPIVACAINE HCL 0.25 % IJ SOLN
INTRAMUSCULAR | Status: DC | PRN
  Administered 2023-05-20: 10 mL

## 2023-05-20 SURGICAL SUPPLY — 24 items
BLADE CLIPPER SENSICLIP SURGIC (BLADE) ×1 IMPLANT
CNTNR URN SCR LID CUP LEK RST (MISCELLANEOUS) ×1 IMPLANT
CONT SPEC 4OZ STRL OR WHT (MISCELLANEOUS) ×1
COVER BACK TABLE 60X90IN (DRAPES) ×1 IMPLANT
DRSG TEGADERM 4X4.75 (GAUZE/BANDAGES/DRESSINGS) ×1 IMPLANT
DRSG TEGADERM 8X12 (GAUZE/BANDAGES/DRESSINGS) ×1 IMPLANT
GAUZE SPONGE 4X4 12PLY STRL (GAUZE/BANDAGES/DRESSINGS) ×1 IMPLANT
GLOVE BIO SURGEON STRL SZ 6.5 (GLOVE) ×1 IMPLANT
IMPL SPACEOAR VUE SYSTEM (Spacer) ×1 IMPLANT
IMPLANT SPACEOAR VUE SYSTEM (Spacer) ×1 IMPLANT
KIT TURNOVER CYSTO (KITS) ×1 IMPLANT
MARKER GOLD PRELOAD 1.2X3 (Urological Implant) ×1 IMPLANT
MARKER SKIN DUAL TIP RULER LAB (MISCELLANEOUS) ×1 IMPLANT
NDL SPNL 22GX3.5 QUINCKE BK (NEEDLE) ×1 IMPLANT
NEEDLE SPNL 22GX3.5 QUINCKE BK (NEEDLE) ×1
SEED GOLD PRELOAD 1.2X3 (Urological Implant) ×1 IMPLANT
SHEATH ULTRASOUND LF (SHEATH) IMPLANT
SHEATH ULTRASOUND LTX NONSTRL (SHEATH) IMPLANT
SLEEVE SCD COMPRESS KNEE MED (STOCKING) ×1 IMPLANT
SURGILUBE 2OZ TUBE FLIPTOP (MISCELLANEOUS) ×1 IMPLANT
SYR 10ML LL (SYRINGE) IMPLANT
SYR CONTROL 10ML LL (SYRINGE) ×1 IMPLANT
TOWEL OR 17X24 6PK STRL BLUE (TOWEL DISPOSABLE) ×1 IMPLANT
UNDERPAD 30X36 HEAVY ABSORB (UNDERPADS AND DIAPERS) ×1 IMPLANT

## 2023-05-20 NOTE — Anesthesia Postprocedure Evaluation (Signed)
Anesthesia Post Note  Patient: Jery Hollern Yarberry  Procedure(s) Performed: GOLD SEED IMPLANT SPACE OAR INSTILLATION     Patient location during evaluation: PACU Anesthesia Type: MAC Level of consciousness: awake and alert Pain management: pain level controlled Vital Signs Assessment: post-procedure vital signs reviewed and stable Respiratory status: spontaneous breathing, nonlabored ventilation, respiratory function stable and patient connected to nasal cannula oxygen Cardiovascular status: stable and blood pressure returned to baseline Postop Assessment: no apparent nausea or vomiting Anesthetic complications: no   No notable events documented.  Last Vitals:  Vitals:   05/20/23 1030 05/20/23 1054  BP: (!) 152/77 (!) 146/88  Pulse: (!) 52 64  Resp: 14 16  Temp: (!) 36.2 C (!) 36.2 C  SpO2: 97% 99%    Last Pain:  Vitals:   05/20/23 1054  TempSrc:   PainSc: 0-No pain                 Mariann Barter

## 2023-05-20 NOTE — Anesthesia Preprocedure Evaluation (Signed)
Anesthesia Evaluation  Patient identified by MRN, date of birth, ID band Patient awake    Reviewed: Allergy & Precautions, NPO status , Patient's Chart, lab work & pertinent test results, reviewed documented beta blocker date and time   History of Anesthesia Complications Negative for: history of anesthetic complications  Airway Mallampati: III  TM Distance: >3 FB     Dental no notable dental hx.    Pulmonary neg COPD, former smoker, neg PE   breath sounds clear to auscultation       Cardiovascular hypertension, (-) angina (-) CAD, (-) Past MI, (-) Cardiac Stents and (-) CABG  Rhythm:Regular Rate:Normal     Neuro/Psych neg Seizures    GI/Hepatic PUD,neg GERD  ,,(+) neg Cirrhosis        Endo/Other  diabetes, Type 2    Renal/GU CRFRenal disease     Musculoskeletal   Abdominal   Peds  Hematology   Anesthesia Other Findings   Reproductive/Obstetrics                              Anesthesia Physical Anesthesia Plan  ASA: 2  Anesthesia Plan: MAC   Post-op Pain Management:    Induction: Intravenous  PONV Risk Score and Plan: 1  Airway Management Planned:   Additional Equipment:   Intra-op Plan:   Post-operative Plan:   Informed Consent: I have reviewed the patients History and Physical, chart, labs and discussed the procedure including the risks, benefits and alternatives for the proposed anesthesia with the patient or authorized representative who has indicated his/her understanding and acceptance.     Dental advisory given  Plan Discussed with: CRNA  Anesthesia Plan Comments:          Anesthesia Quick Evaluation

## 2023-05-20 NOTE — H&P (Signed)
CC/HPI: Pt presents today for pre-operative history and physical exam in anticipation of fiducial markers and space oar placement by Dr. Cardell Peach on 05/20/23. He is doing well and is without complaint.   Pt denies F/C, HA, CP, SOB, N/V, diarrhea/constipation, back pain, flank pain, hematuria, and dysuria.    HX:   Fernando Jordan is a 72 year old male who is seen to discuss his new diagnosis of prostate cancer and definitive treatment options.   1. Localized unfavorable intermediate risk prostate cancer:  Patient underwent prostate biopsy on 01/13/2023 for an elevated PSA of 5.3 ng/mL and DRE with firm right-sided prostate nodule. Biopsy revealed GS 4+3 = 7 in 3/12 cores, adenocarcinoma of the prostate with 3/12 total cores positive (right base, right lateral base and right lateral mid, 10-20%), TRUS volume of 120 cm3.  PSMA PET scan 02/18/2023 with no evidence of metastatic adenopathy, visceral metastasis or skeletal metastasis. Incidentally was found to have an atrophic left kidney.   Denies new or worsening bone or back pain. Good appetite and stable weight.   Family history: Denies   PMH: He has a past medical history of hypertension, obesity, hepatic steatosis, gout, aortic atherosclerosis, hyperlipidemia, CAD, type 2 diabetes.  PSH: No prior abdominal surgery   TNM stage: cT2a  PSA: 5.3  Gleason score: GS 4+3 = 7  Biopsy: 01/13/2023  Left: BPH only  Right: 4+3 = 7 at the right lateral base, right base and right lateral mid  Prostate volume: 120 cc  PSAD: 0.04   Nomogram  CSS (15 year): 94%  PFS (5 year, 10 year): 60%, 44%  EPE: 56%  LNI: 11%  SVI:9%   IPSS: 3, quality-of-life 1. Despite his large prostate size, he has no significant voiding complaints.  SHIM: he did not complete this form.   He has met with Dr. Kathrynn Running and has elected proceed with EBRT with ST-ADT.   #2. History of urolithiasis: He has remote history of urolithiasis and has had MET x 2 and underwent ESWL in 2017. He  said no recurrence in the past 7 years. He denies flank pain.   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 He is traveling to [place] in October.     ALLERGIES: Demerol TABS - unknown    MEDICATIONS: Allopurinol 100 mg tablet Oral  Indomethacin 50 mg capsule Oral  Micardis Hct 40 mg-12.5 mg tablet Oral  Prilosec 20 mg capsule,delayed release Oral  Tamsulosin HCl - 0.4 MG Oral Capsule 0 Oral  Vitamin D3 50 mcg (2,000 unit) tablet Oral     GU PSH: ESWL - 2017 Prostate Needle Biopsy - 01/13/2023       PSH Notes: Renal Lithotripsy, Surgery Excision Lipoma   NON-GU PSH: Surgical Pathology, Gross And Microscopic Examination For Prostate Needle - 01/13/2023 Visit Complexity (formerly GPC1X) - 04/09/2023, 03/06/2023, 01/20/2023     GU PMH: BPH w/o LUTS - 04/09/2023, - 03/06/2023, - 01/13/2023, - 11/18/2022 Prostate Cancer - 04/09/2023, - 03/06/2023, - 01/20/2023 Elevated PSA - 01/13/2023, - 11/18/2022 Renal calculus, Nephrolithiasis - 2017 History of urolithiasis, History of nephrolithiasis - 2017    NON-GU PMH: Encounter for general adult medical examination without abnormal findings, Encounter for preventive health examination - 2017    FAMILY HISTORY: No pertinent family history - Runs In Family   SOCIAL HISTORY: Marital Status: Single Current Smoking Status: Patient does not smoke anymore.   Tobacco Use Assessment Completed: Used Tobacco in last 30 days? Does not use smokeless tobacco. Types of alcohol consumed:  Wine. Social Drinker.  Patient uses recreational drugs. Uses marijuana. Drinks 1 caffeinated drink per day. Has not had a blood transfusion.     Notes: Smoked a pipe for 20 years and quit 30 years ago  Marijuana couple of hits every other week    REVIEW OF SYSTEMS:    GU Review Male:   Patient reports frequent urination. Patient denies hard to postpone urination, burning/ pain with urination, get up at night to urinate, leakage of urine, stream starts and stops, trouble starting your  stream, have to strain to urinate , erection problems, and penile pain.  Gastrointestinal (Upper):   Patient denies nausea, vomiting, and indigestion/ heartburn.  Gastrointestinal (Lower):   Patient denies diarrhea and constipation.  Constitutional:   Patient denies fever, night sweats, weight loss, and fatigue.  Skin:   Patient denies skin rash/ lesion and itching.  Eyes:   Patient denies double vision and blurred vision.  Ears/ Nose/ Throat:   Patient denies sore throat and sinus problems.  Hematologic/Lymphatic:   Patient denies swollen glands and easy bruising.  Cardiovascular:   Patient denies leg swelling and chest pains.  Respiratory:   Patient denies cough and shortness of breath.  Endocrine:   Patient denies excessive thirst.  Musculoskeletal:   Patient denies back pain and joint pain.  Neurological:   Patient denies headaches and dizziness.  Psychologic:   Patient denies depression and anxiety.   VITAL SIGNS:      05/13/2023 03:59 PM  BP 129/85 mmHg  Pulse 93 /min  Temperature 98.1 F / 36.7 C   MULTI-SYSTEM PHYSICAL EXAMINATION:    Constitutional: Well-nourished. No physical deformities. Normally developed. Good grooming.  Neck: Neck symmetrical, not swollen. Normal tracheal position.  Respiratory: Normal breath sounds. No labored breathing, no use of accessory muscles.   Cardiovascular: Regular rate and rhythm. No murmur, no gallop.   Lymphatic: No enlargement of neck, axillae, groin.  Skin: No paleness, no jaundice, no cyanosis. No lesion, no ulcer, no rash.  Neurologic / Psychiatric: Oriented to time, oriented to place, oriented to person. No depression, no anxiety, no agitation.  Gastrointestinal: No mass, no tenderness, no rigidity, non obese abdomen.  Eyes: Normal conjunctivae. Normal eyelids.  Ears, Nose, Mouth, and Throat: Left ear no scars, no lesions, no masses. Right ear no scars, no lesions, no masses. Nose no scars, no lesions, no masses. Normal hearing. Normal  lips.  Musculoskeletal: Normal gait and station of head and neck.     Complexity of Data:  Records Review:   Previous Patient Records   05/13/23  Urinalysis  Urine Appearance Clear   Urine Color Yellow   Urine Glucose Neg mg/dL  Urine Bilirubin Neg mg/dL  Urine Ketones Neg mg/dL  Urine Specific Gravity 1.025   Urine Blood Neg ery/uL  Urine pH <=5.0   Urine Protein Neg mg/dL  Urine Urobilinogen 0.2 mg/dL  Urine Nitrites Neg   Urine Leukocyte Esterase Neg leu/uL   PROCEDURES:          Urinalysis Dipstick Dipstick Cont'd  Color: Yellow Bilirubin: Neg mg/dL  Appearance: Clear Ketones: Neg mg/dL  Specific Gravity: 0.737 Blood: Neg ery/uL  pH: <=5.0 Protein: Neg mg/dL  Glucose: Neg mg/dL Urobilinogen: 0.2 mg/dL    Nitrites: Neg    Leukocyte Esterase: Neg leu/uL    ASSESSMENT:      ICD-10 Details  1 GU:   Prostate Cancer - C61    PLAN:           Schedule  Return Visit/Planned Activity: Keep Scheduled Appointment - Schedule Surgery          Document Letter(s):  Created for Patient: Clinical Summary         Notes:   There are no changes in the patients history or physical exam since last evaluation by Dr. Cardell Peach. Pt is scheduled to undergo fiducial marker and space oar placement on 05/20/23.

## 2023-05-20 NOTE — Op Note (Addendum)
Preoperative diagnosis: Clinically localized adenocarcinoma of the prostate   Postoperative diagnosis: Clinically localized adenocarcinoma of the prostate  Procedure: 1) Placement of fiducial markers into prostate                    2) Insertion of SpaceOAR hydrogel   Surgeon: Kasandra Knudsen, MD   Anesthesia: General  EBL: Minimal  Complications: None  Indication: Fernando Jordan is a 72 y.o. gentleman with clinically localized prostate cancer. After discussing management options for treatment, he elected to proceed with radiotherapy. He presents today for the above procedures. The potential risks, complications, alternative options, and expected recovery course have been discussed in detail with the patient and he has provided informed consent to proceed.  Description of procedure: The patient was administered preoperative antibiotics, placed in the dorsal lithotomy position, and prepped and draped in the usual sterile fashion. Next, transrectal ultrasonography was utilized to visualize the prostate. Three gold fiducial markers were then placed into the prostate via transperineal needles under ultrasound guidance at the right apex, right base, and left mid gland under direct ultrasound guidance. A site in the midline was then selected on the perineum for placement of an 18 g needle with saline. The needle was advanced above the rectum and below Denonvillier's fascia to the mid gland and confirmed to be in the midline on transverse imaging. One cc of saline was injected confirming appropriate expansion of this space. A total of 10 cc of saline was then injected to open the space further bilaterally. The saline syringe was then removed and the SpaceOAR hydrogel was injected with good distribution bilaterally. He tolerated the procedure well and without complications. He was given a voiding trial prior to discharge from the PACU.

## 2023-05-20 NOTE — Anesthesia Procedure Notes (Signed)
Procedure Name: MAC Date/Time: 05/20/2023 9:48 AM  Performed by: Francie Massing, CRNAPre-anesthesia Checklist: Patient identified, Emergency Drugs available, Suction available and Patient being monitored Oxygen Delivery Method: Simple face mask

## 2023-05-20 NOTE — Discharge Instructions (Addendum)
Fiducial Markers and SpaceOAR  For several days the patient:  should increase his fluid intake and limit strenuous activity. he might have mild discomfort at the base of his penis or in his rectum. he might have blood in his urine or blood in his bowel movements.  For 2-3 months he might have blood in his ejaculate (semen).  Call the office immedicately:  for blood clots in the urine or bowel movements,  difficulty urinating,  inability to urinate,  urinary retention,  painful or frequent urination,  fever, chills,  nausea, vomiting, other illness.     Post Anesthesia Home Care Instructions  Activity: Get plenty of rest for the remainder of the day. A responsible individual must stay with you for 24 hours following the procedure.  For the next 24 hours, DO NOT: -Drive a car -Advertising copywriter -Drink alcoholic beverages -Take any medication unless instructed by your physician -Make any legal decisions or sign important papers.  Meals: Start with liquid foods such as gelatin or soup. Progress to regular foods as tolerated. Avoid greasy, spicy, heavy foods. If nausea and/or vomiting occur, drink only clear liquids until the nausea and/or vomiting subsides. Call your physician if vomiting continues.  Special Instructions/Symptoms: Your throat may feel dry or sore from the anesthesia or the breathing tube placed in your throat during surgery. If this causes discomfort, gargle with warm salt water. The discomfort should disappear within 24 hours.

## 2023-05-20 NOTE — Telephone Encounter (Signed)
CALLED PATIENT TO REMIND OF SIM APPT. FOR 05-23-23- ARRIVAL TIME- 2:45 PM @ [facility], INFORMED PATIENT TO ARRIVE WITH A FULL BLADDER, SPOKE WITH PATIENT AND HE IS AWARE OF THIS APPT. AND THE INSTRUCTIONS

## 2023-05-20 NOTE — Transfer of Care (Signed)
Immediate Anesthesia Transfer of Care Note  Patient: Fernando Jordan  Procedure(s) Performed: Procedure(s) (LRB): GOLD SEED IMPLANT (N/A) SPACE OAR INSTILLATION (N/A)  Patient Location: PACU  Anesthesia Type: MAC  Level of Consciousness: awake, alert , oriented and patient cooperative  Airway & Oxygen Therapy: Patient Spontanous Breathing and Patient connected to face mask oxygen  Post-op Assessment: Report given to PACU RN and Post -op Vital signs reviewed and stable  Post vital signs: Reviewed and stable  Complications: No apparent anesthesia complications  Last Vitals:  Vitals Value Taken Time  BP    Temp    Pulse 54 05/20/23 1009  Resp 13 05/20/23 1009  SpO2 100 % 05/20/23 1009  Vitals shown include unfiled device data.  Last Pain:  Vitals:   05/20/23 0910  TempSrc: Oral  PainSc: 0-No pain      Patients Stated Pain Goal: 3 (05/20/23 0910)  Complications: No notable events documented.

## 2023-05-22 ENCOUNTER — Encounter (HOSPITAL_BASED_OUTPATIENT_CLINIC_OR_DEPARTMENT_OTHER): Payer: Self-pay | Admitting: Urology

## 2023-05-23 ENCOUNTER — Ambulatory Visit
Admission: RE | Admit: 2023-05-23 | Discharge: 2023-05-23 | Disposition: A | Discharge status: Home / Self Care | Payer: Medicare Other | Source: Ambulatory Visit | Attending: Radiation Oncology | Admitting: Radiation Oncology

## 2023-05-23 DIAGNOSIS — Z510 Encounter for antineoplastic radiation therapy: Secondary | ICD-10-CM | POA: Insufficient documentation

## 2023-05-23 DIAGNOSIS — C61 Malignant neoplasm of prostate: Secondary | ICD-10-CM | POA: Diagnosis present

## 2023-05-23 DIAGNOSIS — Z191 Hormone sensitive malignancy status: Secondary | ICD-10-CM | POA: Diagnosis not present

## 2023-05-26 DIAGNOSIS — Z510 Encounter for antineoplastic radiation therapy: Secondary | ICD-10-CM | POA: Diagnosis not present

## 2023-05-26 DIAGNOSIS — Z191 Hormone sensitive malignancy status: Secondary | ICD-10-CM | POA: Diagnosis not present

## 2023-06-09 ENCOUNTER — Other Ambulatory Visit: Payer: Self-pay

## 2023-06-09 ENCOUNTER — Ambulatory Visit
Admission: RE | Admit: 2023-06-09 | Discharge: 2023-06-09 | Disposition: A | Discharge status: Home / Self Care | Payer: Medicare Other | Source: Ambulatory Visit | Attending: Radiation Oncology

## 2023-06-09 DIAGNOSIS — Z510 Encounter for antineoplastic radiation therapy: Secondary | ICD-10-CM | POA: Diagnosis not present

## 2023-06-09 DIAGNOSIS — Z191 Hormone sensitive malignancy status: Secondary | ICD-10-CM | POA: Diagnosis not present

## 2023-06-09 LAB — RAD ONC ARIA SESSION SUMMARY
Course Elapsed Days: 0
Plan Fractions Treated to Date: 1
Plan Prescribed Dose Per Fraction: 2.5 Gy
Plan Total Fractions Prescribed: 28
Plan Total Prescribed Dose: 70 Gy
Reference Point Dosage Given to Date: 2.5 Gy
Reference Point Session Dosage Given: 2.5 Gy
Session Number: 1

## 2023-06-10 ENCOUNTER — Ambulatory Visit
Admission: RE | Admit: 2023-06-10 | Discharge: 2023-06-10 | Disposition: A | Discharge status: Home / Self Care | Payer: Medicare Other | Source: Ambulatory Visit | Attending: Radiation Oncology

## 2023-06-10 ENCOUNTER — Other Ambulatory Visit: Payer: Self-pay

## 2023-06-10 DIAGNOSIS — Z510 Encounter for antineoplastic radiation therapy: Secondary | ICD-10-CM | POA: Diagnosis not present

## 2023-06-10 DIAGNOSIS — Z191 Hormone sensitive malignancy status: Secondary | ICD-10-CM | POA: Diagnosis not present

## 2023-06-10 LAB — RAD ONC ARIA SESSION SUMMARY
Course Elapsed Days: 1
Plan Fractions Treated to Date: 2
Plan Prescribed Dose Per Fraction: 2.5 Gy
Plan Total Fractions Prescribed: 28
Plan Total Prescribed Dose: 70 Gy
Reference Point Dosage Given to Date: 5 Gy
Reference Point Session Dosage Given: 2.5 Gy
Session Number: 2

## 2023-06-11 ENCOUNTER — Ambulatory Visit
Admission: RE | Admit: 2023-06-11 | Discharge: 2023-06-11 | Disposition: A | Discharge status: Home / Self Care | Payer: Medicare Other | Source: Ambulatory Visit | Attending: Radiation Oncology

## 2023-06-11 ENCOUNTER — Other Ambulatory Visit: Payer: Self-pay

## 2023-06-11 DIAGNOSIS — Z191 Hormone sensitive malignancy status: Secondary | ICD-10-CM | POA: Diagnosis not present

## 2023-06-11 DIAGNOSIS — Z510 Encounter for antineoplastic radiation therapy: Secondary | ICD-10-CM | POA: Diagnosis not present

## 2023-06-11 LAB — RAD ONC ARIA SESSION SUMMARY
Course Elapsed Days: 2
Plan Fractions Treated to Date: 3
Plan Prescribed Dose Per Fraction: 2.5 Gy
Plan Total Fractions Prescribed: 28
Plan Total Prescribed Dose: 70 Gy
Reference Point Dosage Given to Date: 7.5 Gy
Reference Point Session Dosage Given: 2.5 Gy
Session Number: 3

## 2023-06-16 ENCOUNTER — Other Ambulatory Visit: Payer: Self-pay

## 2023-06-16 ENCOUNTER — Ambulatory Visit
Admission: RE | Admit: 2023-06-16 | Discharge: 2023-06-16 | Disposition: A | Discharge status: Home / Self Care | Payer: Medicare Other | Source: Ambulatory Visit | Attending: Radiation Oncology | Admitting: Radiation Oncology

## 2023-06-16 ENCOUNTER — Ambulatory Visit
Admission: RE | Admit: 2023-06-16 | Discharge: 2023-06-16 | Disposition: A | Discharge status: Home / Self Care | Payer: Medicare Other | Source: Ambulatory Visit | Attending: Radiation Oncology

## 2023-06-16 DIAGNOSIS — Z510 Encounter for antineoplastic radiation therapy: Secondary | ICD-10-CM | POA: Diagnosis not present

## 2023-06-16 DIAGNOSIS — Z191 Hormone sensitive malignancy status: Secondary | ICD-10-CM | POA: Diagnosis not present

## 2023-06-16 DIAGNOSIS — C61 Malignant neoplasm of prostate: Secondary | ICD-10-CM | POA: Insufficient documentation

## 2023-06-16 LAB — RAD ONC ARIA SESSION SUMMARY
Course Elapsed Days: 7
Plan Fractions Treated to Date: 4
Plan Prescribed Dose Per Fraction: 2.5 Gy
Plan Total Fractions Prescribed: 28
Plan Total Prescribed Dose: 70 Gy
Reference Point Dosage Given to Date: 10 Gy
Reference Point Session Dosage Given: 2.5 Gy
Session Number: 4

## 2023-06-17 ENCOUNTER — Ambulatory Visit
Admission: RE | Admit: 2023-06-17 | Discharge: 2023-06-17 | Disposition: A | Discharge status: Home / Self Care | Payer: Medicare Other | Source: Ambulatory Visit | Attending: Radiation Oncology

## 2023-06-17 ENCOUNTER — Other Ambulatory Visit: Payer: Self-pay

## 2023-06-17 DIAGNOSIS — Z510 Encounter for antineoplastic radiation therapy: Secondary | ICD-10-CM | POA: Diagnosis not present

## 2023-06-17 DIAGNOSIS — Z191 Hormone sensitive malignancy status: Secondary | ICD-10-CM | POA: Diagnosis not present

## 2023-06-17 LAB — RAD ONC ARIA SESSION SUMMARY
Course Elapsed Days: 8
Plan Fractions Treated to Date: 5
Plan Prescribed Dose Per Fraction: 2.5 Gy
Plan Total Fractions Prescribed: 28
Plan Total Prescribed Dose: 70 Gy
Reference Point Dosage Given to Date: 12.5 Gy
Reference Point Session Dosage Given: 2.5 Gy
Session Number: 5

## 2023-06-18 ENCOUNTER — Other Ambulatory Visit: Payer: Self-pay

## 2023-06-18 ENCOUNTER — Ambulatory Visit
Admission: RE | Admit: 2023-06-18 | Discharge: 2023-06-18 | Disposition: A | Discharge status: Home / Self Care | Payer: Medicare Other | Source: Ambulatory Visit | Attending: Radiation Oncology

## 2023-06-18 DIAGNOSIS — Z510 Encounter for antineoplastic radiation therapy: Secondary | ICD-10-CM | POA: Diagnosis not present

## 2023-06-18 DIAGNOSIS — Z191 Hormone sensitive malignancy status: Secondary | ICD-10-CM | POA: Diagnosis not present

## 2023-06-18 LAB — RAD ONC ARIA SESSION SUMMARY
Course Elapsed Days: 9
Plan Fractions Treated to Date: 6
Plan Prescribed Dose Per Fraction: 2.5 Gy
Plan Total Fractions Prescribed: 28
Plan Total Prescribed Dose: 70 Gy
Reference Point Dosage Given to Date: 15 Gy
Reference Point Session Dosage Given: 2.5 Gy
Session Number: 6

## 2023-06-19 ENCOUNTER — Ambulatory Visit
Admission: RE | Admit: 2023-06-19 | Discharge: 2023-06-19 | Disposition: A | Discharge status: Home / Self Care | Payer: Medicare Other | Source: Ambulatory Visit | Attending: Radiation Oncology | Admitting: Radiation Oncology

## 2023-06-19 ENCOUNTER — Other Ambulatory Visit: Payer: Self-pay

## 2023-06-19 DIAGNOSIS — Z510 Encounter for antineoplastic radiation therapy: Secondary | ICD-10-CM | POA: Diagnosis not present

## 2023-06-19 DIAGNOSIS — Z191 Hormone sensitive malignancy status: Secondary | ICD-10-CM | POA: Diagnosis not present

## 2023-06-19 LAB — RAD ONC ARIA SESSION SUMMARY
Course Elapsed Days: 10
Plan Fractions Treated to Date: 7
Plan Prescribed Dose Per Fraction: 2.5 Gy
Plan Total Fractions Prescribed: 28
Plan Total Prescribed Dose: 70 Gy
Reference Point Dosage Given to Date: 17.5 Gy
Reference Point Session Dosage Given: 2.5 Gy
Session Number: 7

## 2023-06-20 ENCOUNTER — Other Ambulatory Visit: Payer: Self-pay

## 2023-06-20 ENCOUNTER — Ambulatory Visit
Admission: RE | Admit: 2023-06-20 | Discharge: 2023-06-20 | Disposition: A | Discharge status: Home / Self Care | Payer: Medicare Other | Source: Ambulatory Visit | Attending: Radiation Oncology | Admitting: Radiation Oncology

## 2023-06-20 ENCOUNTER — Ambulatory Visit: Payer: Medicare Other

## 2023-06-20 DIAGNOSIS — Z510 Encounter for antineoplastic radiation therapy: Secondary | ICD-10-CM | POA: Diagnosis not present

## 2023-06-20 DIAGNOSIS — Z191 Hormone sensitive malignancy status: Secondary | ICD-10-CM | POA: Diagnosis not present

## 2023-06-20 LAB — RAD ONC ARIA SESSION SUMMARY
Course Elapsed Days: 11
Plan Fractions Treated to Date: 8
Plan Prescribed Dose Per Fraction: 2.5 Gy
Plan Total Fractions Prescribed: 28
Plan Total Prescribed Dose: 70 Gy
Reference Point Dosage Given to Date: 20 Gy
Reference Point Session Dosage Given: 2.5 Gy
Session Number: 8

## 2023-06-23 ENCOUNTER — Ambulatory Visit
Admission: RE | Admit: 2023-06-23 | Discharge: 2023-06-23 | Disposition: A | Discharge status: Home / Self Care | Payer: Medicare Other | Source: Ambulatory Visit | Attending: Radiation Oncology

## 2023-06-23 ENCOUNTER — Other Ambulatory Visit: Payer: Self-pay

## 2023-06-23 DIAGNOSIS — Z510 Encounter for antineoplastic radiation therapy: Secondary | ICD-10-CM | POA: Diagnosis not present

## 2023-06-23 DIAGNOSIS — Z191 Hormone sensitive malignancy status: Secondary | ICD-10-CM | POA: Diagnosis not present

## 2023-06-23 LAB — RAD ONC ARIA SESSION SUMMARY
Course Elapsed Days: 14
Plan Fractions Treated to Date: 9
Plan Prescribed Dose Per Fraction: 2.5 Gy
Plan Total Fractions Prescribed: 28
Plan Total Prescribed Dose: 70 Gy
Reference Point Dosage Given to Date: 22.5 Gy
Reference Point Session Dosage Given: 2.5 Gy
Session Number: 9

## 2023-06-24 ENCOUNTER — Ambulatory Visit
Admission: RE | Admit: 2023-06-24 | Discharge: 2023-06-24 | Disposition: A | Discharge status: Home / Self Care | Payer: Medicare Other | Source: Ambulatory Visit | Attending: Radiation Oncology

## 2023-06-24 ENCOUNTER — Other Ambulatory Visit: Payer: Self-pay

## 2023-06-24 DIAGNOSIS — Z510 Encounter for antineoplastic radiation therapy: Secondary | ICD-10-CM | POA: Diagnosis not present

## 2023-06-24 DIAGNOSIS — Z191 Hormone sensitive malignancy status: Secondary | ICD-10-CM | POA: Diagnosis not present

## 2023-06-24 LAB — RAD ONC ARIA SESSION SUMMARY
Course Elapsed Days: 15
Plan Fractions Treated to Date: 10
Plan Prescribed Dose Per Fraction: 2.5 Gy
Plan Total Fractions Prescribed: 28
Plan Total Prescribed Dose: 70 Gy
Reference Point Dosage Given to Date: 25 Gy
Reference Point Session Dosage Given: 2.5 Gy
Session Number: 10

## 2023-06-25 ENCOUNTER — Other Ambulatory Visit: Payer: Self-pay

## 2023-06-25 ENCOUNTER — Ambulatory Visit: Payer: Medicare Other

## 2023-06-25 ENCOUNTER — Ambulatory Visit
Admission: RE | Admit: 2023-06-25 | Discharge: 2023-06-25 | Disposition: A | Discharge status: Home / Self Care | Payer: Medicare Other | Source: Ambulatory Visit | Attending: Radiation Oncology | Admitting: Radiation Oncology

## 2023-06-25 DIAGNOSIS — Z510 Encounter for antineoplastic radiation therapy: Secondary | ICD-10-CM | POA: Diagnosis not present

## 2023-06-25 DIAGNOSIS — Z191 Hormone sensitive malignancy status: Secondary | ICD-10-CM | POA: Diagnosis not present

## 2023-06-25 LAB — RAD ONC ARIA SESSION SUMMARY
Course Elapsed Days: 16
Plan Fractions Treated to Date: 11
Plan Prescribed Dose Per Fraction: 2.5 Gy
Plan Total Fractions Prescribed: 28
Plan Total Prescribed Dose: 70 Gy
Reference Point Dosage Given to Date: 27.5 Gy
Reference Point Session Dosage Given: 2.5 Gy
Session Number: 11

## 2023-06-26 ENCOUNTER — Other Ambulatory Visit: Payer: Self-pay

## 2023-06-26 ENCOUNTER — Ambulatory Visit
Admission: RE | Admit: 2023-06-26 | Discharge: 2023-06-26 | Disposition: A | Discharge status: Home / Self Care | Payer: Medicare Other | Source: Ambulatory Visit | Attending: Radiation Oncology | Admitting: Radiation Oncology

## 2023-06-26 DIAGNOSIS — Z510 Encounter for antineoplastic radiation therapy: Secondary | ICD-10-CM | POA: Diagnosis not present

## 2023-06-26 DIAGNOSIS — Z191 Hormone sensitive malignancy status: Secondary | ICD-10-CM | POA: Diagnosis not present

## 2023-06-26 LAB — RAD ONC ARIA SESSION SUMMARY
Course Elapsed Days: 17
Plan Fractions Treated to Date: 12
Plan Prescribed Dose Per Fraction: 2.5 Gy
Plan Total Fractions Prescribed: 28
Plan Total Prescribed Dose: 70 Gy
Reference Point Dosage Given to Date: 30 Gy
Reference Point Session Dosage Given: 2.5 Gy
Session Number: 12

## 2023-06-27 ENCOUNTER — Other Ambulatory Visit: Payer: Self-pay

## 2023-06-27 ENCOUNTER — Ambulatory Visit
Admission: RE | Admit: 2023-06-27 | Discharge: 2023-06-27 | Disposition: A | Discharge status: Home / Self Care | Payer: Medicare Other | Source: Ambulatory Visit | Attending: Radiation Oncology

## 2023-06-27 DIAGNOSIS — Z191 Hormone sensitive malignancy status: Secondary | ICD-10-CM | POA: Diagnosis not present

## 2023-06-27 DIAGNOSIS — Z510 Encounter for antineoplastic radiation therapy: Secondary | ICD-10-CM | POA: Diagnosis not present

## 2023-06-27 LAB — RAD ONC ARIA SESSION SUMMARY
Course Elapsed Days: 18
Plan Fractions Treated to Date: 13
Plan Prescribed Dose Per Fraction: 2.5 Gy
Plan Total Fractions Prescribed: 28
Plan Total Prescribed Dose: 70 Gy
Reference Point Dosage Given to Date: 32.5 Gy
Reference Point Session Dosage Given: 2.5 Gy
Session Number: 13

## 2023-06-30 ENCOUNTER — Other Ambulatory Visit: Payer: Self-pay

## 2023-06-30 ENCOUNTER — Ambulatory Visit
Admission: RE | Admit: 2023-06-30 | Discharge: 2023-06-30 | Disposition: A | Discharge status: Home / Self Care | Payer: Medicare Other | Source: Ambulatory Visit | Attending: Radiation Oncology

## 2023-06-30 DIAGNOSIS — Z191 Hormone sensitive malignancy status: Secondary | ICD-10-CM | POA: Diagnosis not present

## 2023-06-30 DIAGNOSIS — Z510 Encounter for antineoplastic radiation therapy: Secondary | ICD-10-CM | POA: Diagnosis not present

## 2023-06-30 LAB — RAD ONC ARIA SESSION SUMMARY
Course Elapsed Days: 21
Plan Fractions Treated to Date: 14
Plan Prescribed Dose Per Fraction: 2.5 Gy
Plan Total Fractions Prescribed: 28
Plan Total Prescribed Dose: 70 Gy
Reference Point Dosage Given to Date: 35 Gy
Reference Point Session Dosage Given: 2.5 Gy
Session Number: 14

## 2023-07-01 ENCOUNTER — Other Ambulatory Visit: Payer: Self-pay

## 2023-07-01 ENCOUNTER — Ambulatory Visit
Admission: RE | Admit: 2023-07-01 | Discharge: 2023-07-01 | Disposition: A | Discharge status: Home / Self Care | Payer: Medicare Other | Source: Ambulatory Visit | Attending: Radiation Oncology

## 2023-07-01 DIAGNOSIS — Z510 Encounter for antineoplastic radiation therapy: Secondary | ICD-10-CM | POA: Diagnosis not present

## 2023-07-01 DIAGNOSIS — Z191 Hormone sensitive malignancy status: Secondary | ICD-10-CM | POA: Diagnosis not present

## 2023-07-01 LAB — RAD ONC ARIA SESSION SUMMARY
Course Elapsed Days: 22
Plan Fractions Treated to Date: 15
Plan Prescribed Dose Per Fraction: 2.5 Gy
Plan Total Fractions Prescribed: 28
Plan Total Prescribed Dose: 70 Gy
Reference Point Dosage Given to Date: 37.5 Gy
Reference Point Session Dosage Given: 2.5 Gy
Session Number: 15

## 2023-07-02 ENCOUNTER — Other Ambulatory Visit: Payer: Self-pay

## 2023-07-02 ENCOUNTER — Ambulatory Visit
Admission: RE | Admit: 2023-07-02 | Discharge: 2023-07-02 | Disposition: A | Discharge status: Home / Self Care | Payer: Medicare Other | Source: Ambulatory Visit | Attending: Radiation Oncology | Admitting: Radiation Oncology

## 2023-07-02 DIAGNOSIS — Z191 Hormone sensitive malignancy status: Secondary | ICD-10-CM | POA: Diagnosis not present

## 2023-07-02 DIAGNOSIS — Z510 Encounter for antineoplastic radiation therapy: Secondary | ICD-10-CM | POA: Diagnosis not present

## 2023-07-02 LAB — RAD ONC ARIA SESSION SUMMARY
Course Elapsed Days: 23
Plan Fractions Treated to Date: 16
Plan Prescribed Dose Per Fraction: 2.5 Gy
Plan Total Fractions Prescribed: 28
Plan Total Prescribed Dose: 70 Gy
Reference Point Dosage Given to Date: 40 Gy
Reference Point Session Dosage Given: 2.5 Gy
Session Number: 16

## 2023-07-03 ENCOUNTER — Other Ambulatory Visit: Payer: Self-pay

## 2023-07-03 ENCOUNTER — Ambulatory Visit
Admission: RE | Admit: 2023-07-03 | Discharge: 2023-07-03 | Disposition: A | Discharge status: Home / Self Care | Payer: Medicare Other | Source: Ambulatory Visit | Attending: Radiation Oncology | Admitting: Radiation Oncology

## 2023-07-03 DIAGNOSIS — Z510 Encounter for antineoplastic radiation therapy: Secondary | ICD-10-CM | POA: Diagnosis not present

## 2023-07-03 DIAGNOSIS — Z191 Hormone sensitive malignancy status: Secondary | ICD-10-CM | POA: Diagnosis not present

## 2023-07-03 LAB — RAD ONC ARIA SESSION SUMMARY
Course Elapsed Days: 24
Plan Fractions Treated to Date: 17
Plan Prescribed Dose Per Fraction: 2.5 Gy
Plan Total Fractions Prescribed: 28
Plan Total Prescribed Dose: 70 Gy
Reference Point Dosage Given to Date: 42.5 Gy
Reference Point Session Dosage Given: 2.5 Gy
Session Number: 17

## 2023-07-04 ENCOUNTER — Ambulatory Visit
Admission: RE | Admit: 2023-07-04 | Discharge: 2023-07-04 | Disposition: A | Discharge status: Home / Self Care | Payer: Medicare Other | Source: Ambulatory Visit | Attending: Radiation Oncology | Admitting: Radiation Oncology

## 2023-07-04 ENCOUNTER — Other Ambulatory Visit: Payer: Self-pay

## 2023-07-04 DIAGNOSIS — Z510 Encounter for antineoplastic radiation therapy: Secondary | ICD-10-CM | POA: Diagnosis not present

## 2023-07-04 DIAGNOSIS — Z191 Hormone sensitive malignancy status: Secondary | ICD-10-CM | POA: Diagnosis not present

## 2023-07-04 LAB — RAD ONC ARIA SESSION SUMMARY
Course Elapsed Days: 25
Plan Fractions Treated to Date: 18
Plan Prescribed Dose Per Fraction: 2.5 Gy
Plan Total Fractions Prescribed: 28
Plan Total Prescribed Dose: 70 Gy
Reference Point Dosage Given to Date: 45 Gy
Reference Point Session Dosage Given: 2.5 Gy
Session Number: 18

## 2023-07-07 ENCOUNTER — Ambulatory Visit
Admission: RE | Admit: 2023-07-07 | Discharge: 2023-07-07 | Disposition: A | Discharge status: Home / Self Care | Payer: Medicare Other | Source: Ambulatory Visit | Attending: Radiation Oncology

## 2023-07-07 ENCOUNTER — Other Ambulatory Visit: Payer: Self-pay

## 2023-07-07 DIAGNOSIS — Z191 Hormone sensitive malignancy status: Secondary | ICD-10-CM | POA: Diagnosis not present

## 2023-07-07 DIAGNOSIS — Z510 Encounter for antineoplastic radiation therapy: Secondary | ICD-10-CM | POA: Diagnosis not present

## 2023-07-07 LAB — RAD ONC ARIA SESSION SUMMARY
Course Elapsed Days: 28
Plan Fractions Treated to Date: 19
Plan Prescribed Dose Per Fraction: 2.5 Gy
Plan Total Fractions Prescribed: 28
Plan Total Prescribed Dose: 70 Gy
Reference Point Dosage Given to Date: 47.5 Gy
Reference Point Session Dosage Given: 2.5 Gy
Session Number: 19

## 2023-07-08 ENCOUNTER — Ambulatory Visit
Admission: RE | Admit: 2023-07-08 | Discharge: 2023-07-08 | Disposition: A | Discharge status: Home / Self Care | Payer: Medicare Other | Source: Ambulatory Visit | Attending: Radiation Oncology

## 2023-07-08 ENCOUNTER — Other Ambulatory Visit: Payer: Self-pay

## 2023-07-08 DIAGNOSIS — Z191 Hormone sensitive malignancy status: Secondary | ICD-10-CM | POA: Diagnosis not present

## 2023-07-08 DIAGNOSIS — Z510 Encounter for antineoplastic radiation therapy: Secondary | ICD-10-CM | POA: Diagnosis not present

## 2023-07-08 LAB — RAD ONC ARIA SESSION SUMMARY
Course Elapsed Days: 29
Plan Fractions Treated to Date: 20
Plan Prescribed Dose Per Fraction: 2.5 Gy
Plan Total Fractions Prescribed: 28
Plan Total Prescribed Dose: 70 Gy
Reference Point Dosage Given to Date: 50 Gy
Reference Point Session Dosage Given: 2.5 Gy
Session Number: 20

## 2023-07-10 ENCOUNTER — Ambulatory Visit
Admission: RE | Admit: 2023-07-10 | Discharge: 2023-07-10 | Disposition: A | Discharge status: Home / Self Care | Payer: Medicare Other | Source: Ambulatory Visit | Attending: Radiation Oncology | Admitting: Radiation Oncology

## 2023-07-10 ENCOUNTER — Other Ambulatory Visit: Payer: Self-pay

## 2023-07-10 DIAGNOSIS — Z510 Encounter for antineoplastic radiation therapy: Secondary | ICD-10-CM | POA: Diagnosis not present

## 2023-07-10 DIAGNOSIS — Z191 Hormone sensitive malignancy status: Secondary | ICD-10-CM | POA: Diagnosis not present

## 2023-07-10 LAB — RAD ONC ARIA SESSION SUMMARY
Course Elapsed Days: 31
Plan Fractions Treated to Date: 21
Plan Prescribed Dose Per Fraction: 2.5 Gy
Plan Total Fractions Prescribed: 28
Plan Total Prescribed Dose: 70 Gy
Reference Point Dosage Given to Date: 52.5 Gy
Reference Point Session Dosage Given: 2.5 Gy
Session Number: 21

## 2023-07-11 ENCOUNTER — Ambulatory Visit
Admission: RE | Admit: 2023-07-11 | Discharge: 2023-07-11 | Disposition: A | Discharge status: Home / Self Care | Payer: Medicare Other | Source: Ambulatory Visit | Attending: Radiation Oncology

## 2023-07-11 ENCOUNTER — Other Ambulatory Visit: Payer: Self-pay

## 2023-07-11 DIAGNOSIS — Z191 Hormone sensitive malignancy status: Secondary | ICD-10-CM | POA: Diagnosis not present

## 2023-07-11 DIAGNOSIS — Z510 Encounter for antineoplastic radiation therapy: Secondary | ICD-10-CM | POA: Diagnosis not present

## 2023-07-11 LAB — RAD ONC ARIA SESSION SUMMARY
Course Elapsed Days: 32
Plan Fractions Treated to Date: 22
Plan Prescribed Dose Per Fraction: 2.5 Gy
Plan Total Fractions Prescribed: 28
Plan Total Prescribed Dose: 70 Gy
Reference Point Dosage Given to Date: 55 Gy
Reference Point Session Dosage Given: 2.5 Gy
Session Number: 22

## 2023-07-14 ENCOUNTER — Other Ambulatory Visit: Payer: Self-pay

## 2023-07-14 ENCOUNTER — Ambulatory Visit
Admission: RE | Admit: 2023-07-14 | Discharge: 2023-07-14 | Disposition: A | Discharge status: Home / Self Care | Payer: Medicare Other | Source: Ambulatory Visit | Attending: Radiation Oncology

## 2023-07-14 DIAGNOSIS — Z510 Encounter for antineoplastic radiation therapy: Secondary | ICD-10-CM | POA: Diagnosis not present

## 2023-07-14 DIAGNOSIS — Z191 Hormone sensitive malignancy status: Secondary | ICD-10-CM | POA: Diagnosis not present

## 2023-07-14 LAB — RAD ONC ARIA SESSION SUMMARY
Course Elapsed Days: 35
Plan Fractions Treated to Date: 23
Plan Prescribed Dose Per Fraction: 2.5 Gy
Plan Total Fractions Prescribed: 28
Plan Total Prescribed Dose: 70 Gy
Reference Point Dosage Given to Date: 57.5 Gy
Reference Point Session Dosage Given: 2.5 Gy
Session Number: 23

## 2023-07-15 ENCOUNTER — Other Ambulatory Visit: Payer: Self-pay

## 2023-07-15 ENCOUNTER — Ambulatory Visit
Admission: RE | Admit: 2023-07-15 | Discharge: 2023-07-15 | Disposition: A | Discharge status: Home / Self Care | Payer: Medicare Other | Source: Ambulatory Visit | Attending: Radiation Oncology | Admitting: Radiation Oncology

## 2023-07-15 DIAGNOSIS — Z510 Encounter for antineoplastic radiation therapy: Secondary | ICD-10-CM | POA: Diagnosis not present

## 2023-07-15 DIAGNOSIS — Z191 Hormone sensitive malignancy status: Secondary | ICD-10-CM | POA: Diagnosis not present

## 2023-07-15 LAB — RAD ONC ARIA SESSION SUMMARY
Course Elapsed Days: 36
Plan Fractions Treated to Date: 24
Plan Prescribed Dose Per Fraction: 2.5 Gy
Plan Total Fractions Prescribed: 28
Plan Total Prescribed Dose: 70 Gy
Reference Point Dosage Given to Date: 60 Gy
Reference Point Session Dosage Given: 2.5 Gy
Session Number: 24

## 2023-07-17 ENCOUNTER — Other Ambulatory Visit: Payer: Self-pay

## 2023-07-17 ENCOUNTER — Ambulatory Visit
Admission: RE | Admit: 2023-07-17 | Discharge: 2023-07-17 | Disposition: A | Discharge status: Home / Self Care | Payer: Medicare Other | Source: Ambulatory Visit | Attending: Radiation Oncology | Admitting: Radiation Oncology

## 2023-07-17 DIAGNOSIS — C61 Malignant neoplasm of prostate: Secondary | ICD-10-CM | POA: Diagnosis present

## 2023-07-17 DIAGNOSIS — Z510 Encounter for antineoplastic radiation therapy: Secondary | ICD-10-CM | POA: Insufficient documentation

## 2023-07-17 DIAGNOSIS — Z191 Hormone sensitive malignancy status: Secondary | ICD-10-CM | POA: Diagnosis not present

## 2023-07-17 LAB — RAD ONC ARIA SESSION SUMMARY
Course Elapsed Days: 38
Plan Fractions Treated to Date: 25
Plan Prescribed Dose Per Fraction: 2.5 Gy
Plan Total Fractions Prescribed: 28
Plan Total Prescribed Dose: 70 Gy
Reference Point Dosage Given to Date: 62.5 Gy
Reference Point Session Dosage Given: 2.5 Gy
Session Number: 25

## 2023-07-18 ENCOUNTER — Other Ambulatory Visit: Payer: Self-pay

## 2023-07-18 ENCOUNTER — Ambulatory Visit
Admission: RE | Admit: 2023-07-18 | Discharge: 2023-07-18 | Disposition: A | Discharge status: Home / Self Care | Payer: Medicare Other | Source: Ambulatory Visit | Attending: Radiation Oncology

## 2023-07-18 ENCOUNTER — Ambulatory Visit: Payer: Medicare Other

## 2023-07-18 DIAGNOSIS — Z510 Encounter for antineoplastic radiation therapy: Secondary | ICD-10-CM | POA: Diagnosis not present

## 2023-07-18 DIAGNOSIS — Z191 Hormone sensitive malignancy status: Secondary | ICD-10-CM | POA: Diagnosis not present

## 2023-07-18 LAB — RAD ONC ARIA SESSION SUMMARY
Course Elapsed Days: 39
Plan Fractions Treated to Date: 26
Plan Prescribed Dose Per Fraction: 2.5 Gy
Plan Total Fractions Prescribed: 28
Plan Total Prescribed Dose: 70 Gy
Reference Point Dosage Given to Date: 65 Gy
Reference Point Session Dosage Given: 2.5 Gy
Session Number: 26

## 2023-07-21 ENCOUNTER — Ambulatory Visit
Admission: RE | Admit: 2023-07-21 | Discharge: 2023-07-21 | Disposition: A | Discharge status: Home / Self Care | Payer: Medicare Other | Source: Ambulatory Visit | Attending: Radiation Oncology | Admitting: Radiation Oncology

## 2023-07-21 ENCOUNTER — Other Ambulatory Visit: Payer: Self-pay

## 2023-07-21 DIAGNOSIS — Z510 Encounter for antineoplastic radiation therapy: Secondary | ICD-10-CM | POA: Diagnosis not present

## 2023-07-21 DIAGNOSIS — Z191 Hormone sensitive malignancy status: Secondary | ICD-10-CM | POA: Diagnosis not present

## 2023-07-21 LAB — RAD ONC ARIA SESSION SUMMARY
Course Elapsed Days: 42
Plan Fractions Treated to Date: 27
Plan Prescribed Dose Per Fraction: 2.5 Gy
Plan Total Fractions Prescribed: 28
Plan Total Prescribed Dose: 70 Gy
Reference Point Dosage Given to Date: 67.5 Gy
Reference Point Session Dosage Given: 2.5 Gy
Session Number: 27

## 2023-07-22 ENCOUNTER — Ambulatory Visit
Admission: RE | Admit: 2023-07-22 | Discharge: 2023-07-22 | Disposition: A | Discharge status: Home / Self Care | Payer: Medicare Other | Source: Ambulatory Visit | Attending: Radiation Oncology | Admitting: Radiation Oncology

## 2023-07-22 ENCOUNTER — Other Ambulatory Visit: Payer: Self-pay

## 2023-07-22 DIAGNOSIS — Z191 Hormone sensitive malignancy status: Secondary | ICD-10-CM | POA: Diagnosis not present

## 2023-07-22 DIAGNOSIS — Z510 Encounter for antineoplastic radiation therapy: Secondary | ICD-10-CM | POA: Diagnosis not present

## 2023-07-22 LAB — RAD ONC ARIA SESSION SUMMARY
Course Elapsed Days: 43
Plan Fractions Treated to Date: 28
Plan Prescribed Dose Per Fraction: 2.5 Gy
Plan Total Fractions Prescribed: 28
Plan Total Prescribed Dose: 70 Gy
Reference Point Dosage Given to Date: 70 Gy
Reference Point Session Dosage Given: 2.5 Gy
Session Number: 28

## 2023-07-23 NOTE — Radiation Completion Notes (Addendum)
  Radiation Oncology         [phone] ________________________________  Name: Fernando Jordan MRN: [id number]  Date: 07/22/2023  DOB: 1951-01-11  Referring Physician: DONNICE SIAD, M.D. Date of Service: 2023-07-23 Radiation Oncologist: Adina Barge, M.D. [facility]     RADIATION ONCOLOGY END OF TREATMENT NOTE     Diagnosis:  73 y.o. gentleman with Stage T2a adenocarcinoma of the prostate with Gleason score of 4+3, and PSA of 5.3.   Intent: Curative     ==========DELIVERED PLANS==========  First Treatment Date: 2023-06-09 Last Treatment Date: 2023-07-22   Plan Name: Prostate Site: Prostate Technique: IMRT Mode: Photon Dose Per Fraction: 2.5 Gy Prescribed Dose (Delivered / Prescribed): 70 Gy / 70 Gy Prescribed Fxs (Delivered / Prescribed): 28 / 28     ==========ON TREATMENT VISIT DATES========== 2023-06-16, 2023-06-20, 2023-06-25, 2023-07-04, 2023-07-11, 2023-07-18   See weekly On Treatment Notes in Epic for details in the Media tab (listed as Progress notes on the On Treatment Visit Dates listed above).  He tolerated the radiation treatments relatively well with only minor urinary symptoms and modest fatigue.  The patient will receive a call in about one month from the radiation oncology department. He will continue follow up with his urologist, Dr. Siad, as well.  ------------------------------------------------   Donnice Barge, MD [facility]  Radiation Oncology Direct Dial: [phone]  Fax: [phone] [email or web address]  Skype  LinkedIn

## 2023-07-31 NOTE — Progress Notes (Signed)
Patient was a RadOnc Consult on 03/25/23 for his stage T2a adenocarcinoma of the prostate with Gleason score of 4+3, and PSA of 5.3.  Patient proceed with treatment recommendations of 5.5 weeks of external beam therapy concurrent with ADT (Eligard 9/25) and had his final radiation treatment on 07/22/23.   Patient is scheduled for a post treatment nurse call on 08/19/2023 and has his first post treatment PSA on 10/02/23 at [facility].   RN left message for call back to review post treatment education/PSA monitoring.

## 2023-08-15 ENCOUNTER — Other Ambulatory Visit: Payer: Self-pay | Admitting: Urology

## 2023-08-15 ENCOUNTER — Encounter: Payer: Self-pay | Admitting: *Deleted

## 2023-08-15 DIAGNOSIS — C61 Malignant neoplasm of prostate: Secondary | ICD-10-CM

## 2023-08-19 ENCOUNTER — Ambulatory Visit
Admission: RE | Admit: 2023-08-19 | Discharge: 2023-08-19 | Disposition: A | Discharge status: Home / Self Care | Payer: Medicare Other | Source: Ambulatory Visit | Attending: Internal Medicine | Admitting: Internal Medicine

## 2023-08-19 ENCOUNTER — Encounter: Payer: Self-pay | Admitting: *Deleted

## 2023-08-19 NOTE — Progress Notes (Signed)
  Radiation Oncology         [phone] ________________________________  Name: Fernando Jordan MRN: [id number]  Date of Service: 08/19/2023  DOB: [birth year]  Post Treatment Telephone Note  Diagnosis:  73 y.o. gentleman with Stage T2a adenocarcinoma of the prostate with Gleason score of 4+3, and PSA of 5.3. (as documented in provider EOT note)  Pre Treatment IPSS Score: 10 (as documented in the provider consult note)  The patient was not available for call today.   Patient has a scheduled follow up visit with his urologist, Dr. Selma, on 10/2023 for ongoing surveillance. He was counseled that PSA levels will be drawn in the urology office, and was reassured that additional time is expected to improve bowel and bladder symptoms. He was encouraged to call back with concerns or questions regarding radiation.    Rosaline Minerva, LPN

## 2023-08-21 ENCOUNTER — Encounter: Payer: Self-pay | Admitting: *Deleted

## 2023-08-22 ENCOUNTER — Encounter: Payer: Self-pay | Admitting: *Deleted

## 2023-08-28 ENCOUNTER — Inpatient Hospital Stay: Payer: Medicare Other | Attending: Adult Health | Admitting: *Deleted

## 2023-08-28 ENCOUNTER — Encounter: Payer: Self-pay | Admitting: *Deleted

## 2023-08-28 DIAGNOSIS — C61 Malignant neoplasm of prostate: Secondary | ICD-10-CM

## 2023-08-28 NOTE — Progress Notes (Signed)
SCP reviewed and completed . Pt will have post-tx PSA labs in March at [facility].Last colonoscopy was 12/05/2014. Next due 11/2024. Next PCP annual visit in April/May. Vaccines UTD. Nutrition and exercise was discussed and encouraged.

## 2023-11-12 DIAGNOSIS — J069 Acute upper respiratory infection, unspecified: Secondary | ICD-10-CM | POA: Diagnosis not present

## 2023-11-12 IMAGING — CR DG CHEST 2V
2 series · 2 of 2 positions shown · non-contrast
Comparison: Chest x-ray 06/02/2010.

CLINICAL DATA: Pneumonia, cough.

EXAM:
CHEST - 2 VIEW

[w chest pa]
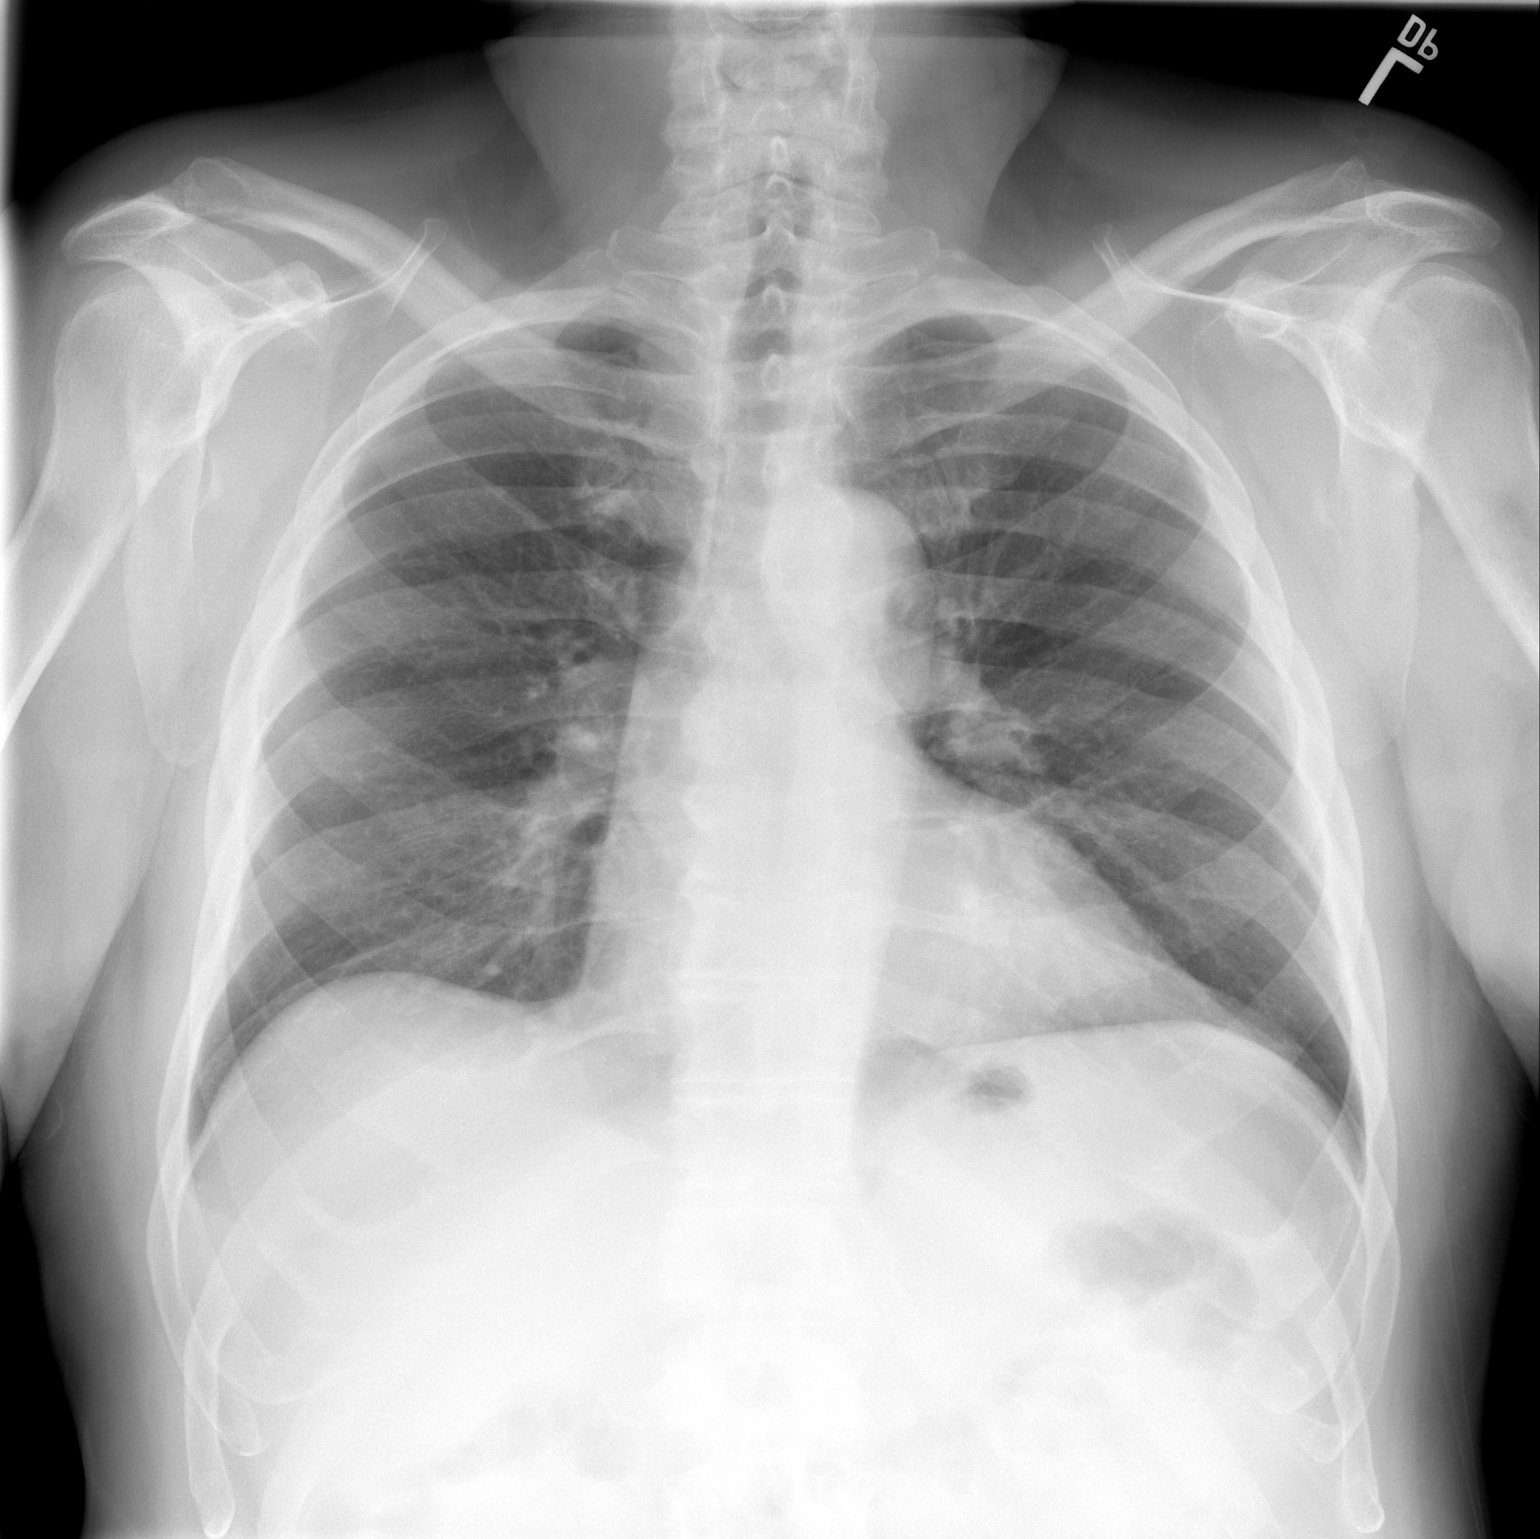

[w chest lat]
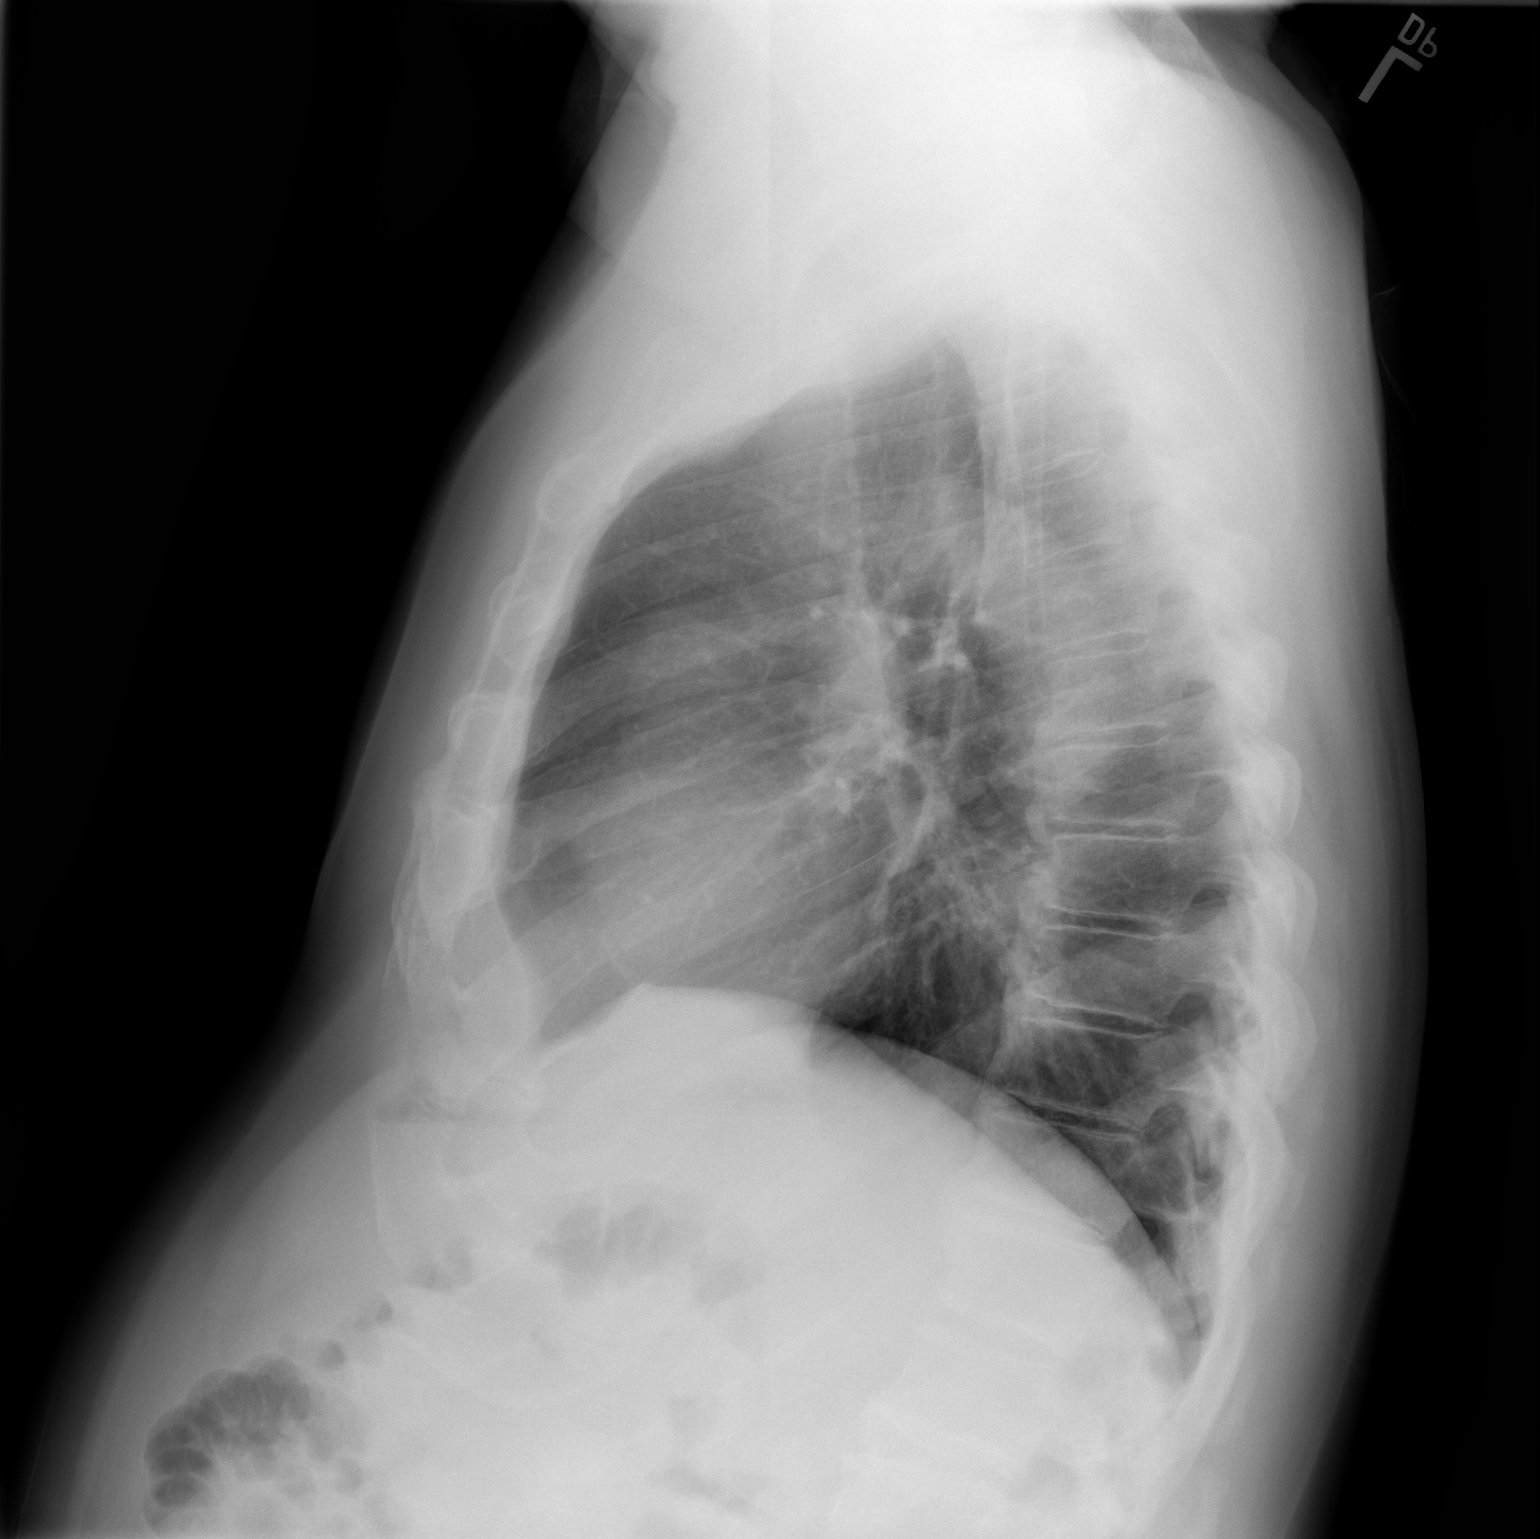

[2 of 2 positions shown; findings below may reference images not displayed]

FINDINGS: The heart size and mediastinal contours are within normal limits.
Both lungs are clear. The visualized skeletal structures are
unremarkable.
IMPRESSION: No active cardiopulmonary disease.

## 2024-03-30 DIAGNOSIS — H35411 Lattice degeneration of retina, right eye: Secondary | ICD-10-CM | POA: Diagnosis not present

## 2024-04-26 NOTE — Progress Notes (Signed)
 Fernando Jordan                                          MRN: [id number]   04/26/2024   The [facility] Quality Team Specialist reviewed this patient medical record for the purposes of chart review for care gap closure. The following were reviewed: chart review for care gap closure-kidney health evaluation for diabetes:eGFR  and uACR.&GSD    [facility] Quality Team
